# Patient Record
Sex: Male | Born: 1950
Health system: Southern US, Community
[De-identification: ages and names within clinical notes are randomized; demographics above are authoritative.]

## PROBLEM LIST (undated history)

## (undated) DIAGNOSIS — I219 Acute myocardial infarction, unspecified: Secondary | ICD-10-CM

## (undated) DIAGNOSIS — E785 Hyperlipidemia, unspecified: Secondary | ICD-10-CM

## (undated) DIAGNOSIS — I2510 Atherosclerotic heart disease of native coronary artery without angina pectoris: Secondary | ICD-10-CM

## (undated) DIAGNOSIS — I10 Essential (primary) hypertension: Secondary | ICD-10-CM

## (undated) DIAGNOSIS — K219 Gastro-esophageal reflux disease without esophagitis: Secondary | ICD-10-CM

## (undated) DIAGNOSIS — F419 Anxiety disorder, unspecified: Secondary | ICD-10-CM

## (undated) HISTORY — DX: Essential (primary) hypertension: I10

## (undated) HISTORY — PX: CARDIAC CATHETERIZATION: SHX172

## (undated) HISTORY — DX: Hyperlipidemia, unspecified: E78.5

## (undated) HISTORY — DX: Acute myocardial infarction, unspecified: I21.9

## (undated) HISTORY — DX: Atherosclerotic heart disease of native coronary artery without angina pectoris: I25.10

## (undated) HISTORY — DX: Anxiety disorder, unspecified: F41.9

## (undated) HISTORY — DX: Gastro-esophageal reflux disease without esophagitis: K21.9

## (undated) HISTORY — PX: CORONARY ANGIOPLASTY: SHX604

---

## 2006-11-13 ENCOUNTER — Inpatient Hospital Stay (HOSPITAL_COMMUNITY): Admission: EM | Admit: 2006-11-13 | Discharge: 2006-11-16 | Payer: Self-pay | Admitting: Emergency Medicine

## 2006-11-13 ENCOUNTER — Ambulatory Visit: Payer: Self-pay | Admitting: Cardiology

## 2006-12-01 ENCOUNTER — Ambulatory Visit: Payer: Self-pay | Admitting: Cardiovascular Disease

## 2006-12-04 ENCOUNTER — Encounter (HOSPITAL_COMMUNITY): Admission: RE | Admit: 2006-12-04 | Discharge: 2007-03-04 | Payer: Self-pay | Admitting: Cardiovascular Disease

## 2007-01-23 ENCOUNTER — Ambulatory Visit: Payer: Self-pay | Admitting: Cardiovascular Disease

## 2007-01-23 LAB — CONVERTED CEMR LAB
Cholesterol: 103 mg/dL (ref 0–200)
HDL: 26.4 mg/dL — ABNORMAL LOW (ref >39.0)
Total Bilirubin: 0.7 mg/dL (ref 0.3–1.2)
Total CHOL/HDL Ratio: 3.9
Total Protein: 6.4 g/dL (ref 6.0–8.3)
Triglycerides: 160 mg/dL — ABNORMAL HIGH (ref 0–149)

## 2007-03-23 ENCOUNTER — Ambulatory Visit: Payer: Self-pay | Admitting: Cardiovascular Disease

## 2007-09-03 ENCOUNTER — Ambulatory Visit: Payer: Self-pay | Admitting: Cardiovascular Disease

## 2007-09-08 ENCOUNTER — Emergency Department (HOSPITAL_COMMUNITY): Admission: EM | Admit: 2007-09-08 | Discharge: 2007-09-08 | Payer: Self-pay | Admitting: Emergency Medicine

## 2008-04-20 ENCOUNTER — Ambulatory Visit: Payer: Self-pay | Admitting: Cardiovascular Disease

## 2008-04-28 ENCOUNTER — Ambulatory Visit: Payer: Self-pay

## 2008-04-28 ENCOUNTER — Ambulatory Visit: Payer: Self-pay | Admitting: Cardiovascular Disease

## 2008-04-28 LAB — CONVERTED CEMR LAB
ALT: 18 units/L (ref 0–53)
AST: 13 units/L (ref 0–37)
Albumin: 3.8 g/dL (ref 3.5–5.2)
Alkaline Phosphatase: 54 units/L (ref 39–117)
BUN: 11 mg/dL (ref 6–23)
CO2: 29 meq/L (ref 19–32)
Chloride: 106 meq/L (ref 96–112)
Cholesterol: 118 mg/dL (ref 0–200)
Creatinine, Ser: 0.8 mg/dL (ref 0.4–1.5)
Potassium: 4.3 meq/L (ref 3.5–5.1)
Total Bilirubin: 0.9 mg/dL (ref 0.3–1.2)
Triglycerides: 163 mg/dL — ABNORMAL HIGH (ref 0–149)

## 2008-05-30 ENCOUNTER — Ambulatory Visit: Payer: Self-pay | Admitting: Internal Medicine

## 2008-05-30 ENCOUNTER — Ambulatory Visit: Payer: Self-pay

## 2008-06-15 ENCOUNTER — Ambulatory Visit: Payer: Self-pay | Admitting: Cardiovascular Disease

## 2008-06-15 DIAGNOSIS — I2119 ST elevation (STEMI) myocardial infarction involving other coronary artery of inferior wall: Secondary | ICD-10-CM | POA: Insufficient documentation

## 2008-06-15 DIAGNOSIS — I10 Essential (primary) hypertension: Secondary | ICD-10-CM | POA: Insufficient documentation

## 2008-06-15 DIAGNOSIS — E785 Hyperlipidemia, unspecified: Secondary | ICD-10-CM

## 2008-06-15 DIAGNOSIS — I2510 Atherosclerotic heart disease of native coronary artery without angina pectoris: Secondary | ICD-10-CM

## 2008-06-28 ENCOUNTER — Ambulatory Visit: Payer: Self-pay | Admitting: Cardiology

## 2008-09-14 ENCOUNTER — Encounter: Payer: Self-pay | Admitting: Cardiovascular Disease

## 2008-09-14 ENCOUNTER — Ambulatory Visit: Payer: Self-pay | Admitting: Cardiovascular Disease

## 2009-03-16 ENCOUNTER — Ambulatory Visit: Payer: Self-pay | Admitting: Cardiovascular Disease

## 2009-06-12 ENCOUNTER — Encounter: Payer: Self-pay | Admitting: Cardiovascular Disease

## 2009-09-08 ENCOUNTER — Ambulatory Visit: Payer: Self-pay | Admitting: Cardiovascular Disease

## 2009-09-14 ENCOUNTER — Ambulatory Visit: Payer: Self-pay | Admitting: Cardiovascular Disease

## 2009-09-18 LAB — CONVERTED CEMR LAB
ALT: 15 units/L (ref 0–53)
Albumin: 4.2 g/dL (ref 3.5–5.2)
BUN: 11 mg/dL (ref 6–23)
Basophils Absolute: 0.1 10*3/uL (ref 0.0–0.1)
CO2: 27 meq/L (ref 19–32)
Calcium: 9 mg/dL (ref 8.4–10.5)
Chloride: 105 meq/L (ref 96–112)
Cholesterol: 118 mg/dL (ref 0–200)
Creatinine, Ser: 0.9 mg/dL (ref 0.4–1.5)
Direct LDL: 53.3 mg/dL
Eosinophils Absolute: 0.4 10*3/uL (ref 0.0–0.7)
Glucose, Bld: 105 mg/dL — ABNORMAL HIGH (ref 70–99)
Lymphocytes Relative: 32.3 % (ref 12.0–46.0)
MCHC: 34.6 g/dL (ref 30.0–36.0)
MCV: 88.4 fL (ref 78.0–100.0)
Monocytes Absolute: 0.7 10*3/uL (ref 0.1–1.0)
Neutrophils Relative %: 53.1 % (ref 43.0–77.0)
Platelets: 287 10*3/uL (ref 150.0–400.0)
RBC: 4.89 M/uL (ref 4.22–5.81)
RDW: 13.4 % (ref 11.5–14.6)
Total Bilirubin: 0.5 mg/dL (ref 0.3–1.2)
Total CHOL/HDL Ratio: 3
Triglycerides: 226 mg/dL — ABNORMAL HIGH (ref 0.0–149.0)

## 2009-12-05 ENCOUNTER — Telehealth: Payer: Self-pay | Admitting: Cardiovascular Disease

## 2010-03-23 ENCOUNTER — Encounter: Payer: Self-pay | Admitting: Cardiovascular Disease

## 2010-03-23 ENCOUNTER — Ambulatory Visit: Payer: Self-pay | Admitting: Cardiovascular Disease

## 2010-06-26 NOTE — Assessment & Plan Note (Signed)
Summary: f20m   Visit Type:  Follow-up Primary Provider:  none  CC:  none.  History of Present Illness: 60 year-old male with hx inferior MI 2008 treated with overlapping DES in the right coronary artery. The patient is doing well at present without complaints. He specifically denies chest pain, dyspnea, edema, palpitations, orthopnea, or PND. He is active and does hard work but he does not perform regular exercise. He has marked whitecoat hypertension.  I have treated his blood pressures in the past based on office readings and he became highly symptomatic with hypotension at home.  Current Medications (verified): 1)  Plavix 75 Mg Tabs (Clopidogrel Bisulfate) .... Take One Tablet By Mouth Daily 2)  Protonix 40 Mg Tbec (Pantoprazole Sodium) .Marland Kitchen.. 1 Tab By Mouth Every Other Day 3)  Lipitor 40 Mg Tabs (Atorvastatin Calcium) .... Take One Tablet By Mouth Daily. 4)  Coreg 12.5 Mg Tabs (Carvedilol) .... Take 1 Tablet By Mouth Two Times A Day 5)  Hydrochlorothiazide 12.5 Mg Tabs (Hydrochlorothiazide) .... Take One Tablet By Mouth Daily. 6)  Lisinopril 40 Mg Tabs (Lisinopril) .... Take One Tablet By Mouth Daily 7)  Aspirin 81 Mg Tbec (Aspirin) .... Take One Tablet By Mouth Daily  Allergies: 1)  ! * Carvedilol  Past History:  Past medical history reviewed for relevance to current acute and chronic problems.  Past Medical History: Reviewed history from 03/16/2009 and no changes required. Current Problems:  MYOCARDIAL INFARCTION, INFERIOR WALL, INITIAL EPISODE (ICD-410.41)RX OVERLAPPING DRUG-ELUTING STENTS RCA WITH RESIDUAL DISEASE LAD & CFX. EF INITIALLY 40% NOW 60-65% HYPERLIPIDEMIA-MIXED (ICD-272.4 HYPERTENSION, BENIGN (ICD-401.1)WHITE COAT SYNDROME CAD, NATIVE VESSEL (ICD-414.01) Anxiety G E R D TOBACCO ABUSE  Review of Systems       Negative except as per HPI   Vital Signs:  Patient profile:   60 year old male Height:      65 inches Weight:      178 pounds BMI:      29.73 Pulse rate:   78 / minute Resp:     14 per minute BP sitting:   178 / 110  (left arm) BP standing:   160 / 98  (right arm)  Vitals Entered By: Kem Parkinson (September 08, 2009 8:48 AM)  Physical Exam  General:  Pt is alert and oriented, in no acute distress. HEENT: normal Neck: normal carotid upstrokes without bruits, JVP normal Lungs: CTA CV: RRR without murmur or gallop Abd: soft, obese, NT, positive BS, no bruit, no organomegaly Ext: no clubbing, cyanosis, or edema. peripheral pulses 2+ and equal Skin: warm and dry without rash    EKG  Procedure date:  09/09/2009  Findings:      Normal sinus rhythm, heart rate 78 beats per minute, no significant ST or T wave changes.  Impression & Recommendations:  Problem # 1:  CAD, NATIVE VESSEL (ICD-414.01) The patient remained stable without angina.  Recommend long-term dual antiplatelet therapy since he is tolerating this well and he was treated with overlapping drug-eluting stents in the setting of acute myocardial infarction His updated medication list for this problem includes:    Plavix 75 Mg Tabs (Clopidogrel bisulfate) .Marland Kitchen... Take one tablet by mouth daily    Coreg 12.5 Mg Tabs (Carvedilol) .Marland Kitchen... Take 1 tablet by mouth two times a day    Lisinopril 40 Mg Tabs (Lisinopril) .Marland Kitchen... Take one tablet by mouth daily    Aspirin 81 Mg Tbec (Aspirin) .Marland Kitchen... Take one tablet by mouth daily  Problem # 2:  HYPERTENSION,  BENIGN (ICD-401.1) office blood pressures remain markedly elevated. We've been through this in the past and he does not tolerate an increase in his antihypertensive program. I asked him to get back to checking blood pressures at home to make sure that his home readings are within normal limits.  His updated medication list for this problem includes:    Coreg 12.5 Mg Tabs (Carvedilol) .Marland Kitchen... Take 1 tablet by mouth two times a day    Hydrochlorothiazide 12.5 Mg Tabs (Hydrochlorothiazide) .Marland Kitchen... Take one tablet by mouth  daily.    Lisinopril 40 Mg Tabs (Lisinopril) .Marland Kitchen... Take one tablet by mouth daily    Aspirin 81 Mg Tbec (Aspirin) .Marland Kitchen... Take one tablet by mouth daily  BP today: 178/110 Prior BP: 162/90 (03/16/2009)  Labs Reviewed: K+: 4.3 (04/28/2008) Creat: : 0.8 (04/28/2008)   Chol: 118 (04/28/2008)   HDL: 27.4 (04/28/2008)   LDL: 58 (04/28/2008)   TG: 163 (04/28/2008)  Problem # 3:  HYPERLIPIDEMIA-MIXED (ICD-272.4) Patient is due for repeat lipids and LFTs. His updated medication list for this problem includes:    Lipitor 40 Mg Tabs (Atorvastatin calcium) .Marland Kitchen... Take one tablet by mouth daily.  CHOL: 118 (04/28/2008)   LDL: 58 (04/28/2008)   HDL: 27.4 (04/28/2008)   TG: 163 (04/28/2008)  Patient Instructions: 1)  Your physician recommends that you schedule a follow-up appointment in: 6 months 2)  Your physician recommends that you return for lab work in:  please come back next week for  CBC, LIPID PANEL,CMET

## 2010-06-26 NOTE — Letter (Signed)
Summary: [facility]  [facility]   Imported By: Roderic Ovens 07/04/2009 13:09:06  _____________________________________________________________________  External Attachment:    Type:   Image     Comment:   External Document

## 2010-06-26 NOTE — Progress Notes (Signed)
Summary: refill request  Phone Note Refill Request   pt requesting rx for nitroquick-pt going out of town wants to take rx w/him pt's wife insisting on a call when it has been called in-pls call [phone]   Method Requested: Telephone to Pharmacy Initial call taken by: Glynda Jaeger,  December 05, 2009 10:20 AM  Follow-up for Phone Call        [facility] called into the pharmacy. I spoke with the pt's wife and made her aware that this Rx was called into [facility] [address].  She was very upset and rude because she said that she called our office yesterday about needing Rx called in so that they could go on vacation.  I made her aware that there is not a note in our system that she called our office yesterday.  I apologized for the miscommunication.  Follow-up by: Julieta Gutting, RN, BSN,  December 05, 2009 10:50 AM    New/Updated Medications: NITROSTAT 0.4 MG SUBL (NITROGLYCERIN) 1 tablet under tongue at onset of chest pain; you may repeat every 5 minutes for up to 3 doses.

## 2010-06-26 NOTE — Assessment & Plan Note (Signed)
Summary: F/U 6 MONTHS   Visit Type:  Follow-up Primary Provider:  none  CC:  Pt. quit taking HCTZ in June- side effects.  History of Present Illness: 60 year-old male with hx inferior MI 2008 treated with overlapping DES in the right coronary artery. The patient is doing well at present without complaints. He specifically denies chest pain, dyspnea, edema, palpitations, orthopnea, or PND. He is active and does hard work but he does not perform regular exercise. He has marked whitecoat hypertension.  I have treated his blood pressures in the past based on office readings and he became highly symptomatic with hypotension at home.  He developed dizziness and attributed this to HCTZ....stopped the medication and symptom resoved within one week. No other problems reported today. Continues to smoke cigarettes.    Current Medications (verified): 1)  Plavix 75 Mg Tabs (Clopidogrel Bisulfate) .... Take One Tablet By Mouth Daily 2)  Protonix 40 Mg Tbec (Pantoprazole Sodium) .Marland Kitchen.. 1 Tab By Mouth Every Other Day 3)  Lipitor 40 Mg Tabs (Atorvastatin Calcium) .... Take One Tablet By Mouth Daily. 4)  Coreg 12.5 Mg Tabs (Carvedilol) .... Take 1 Tablet By Mouth Two Times A Day 5)  Lisinopril 40 Mg Tabs (Lisinopril) .... Take One Tablet By Mouth Daily 6)  Aspirin 81 Mg Tbec (Aspirin) .... Take One Tablet By Mouth Daily 7)  Nitrostat 0.4 Mg Subl (Nitroglycerin) .Marland Kitchen.. 1 Tablet Under Tongue At Onset of Chest Pain; You May Repeat Every 5 Minutes For Up To 3 Doses.  Allergies: 1)  ! * Carvedilol 2)  ! Hydrochlorothiazide  Past History:  Past medical history reviewed for relevance to current acute and chronic problems.  Past Medical History: Reviewed history from 03/16/2009 and no changes required. Current Problems:  MYOCARDIAL INFARCTION, INFERIOR WALL, INITIAL EPISODE (ICD-410.41)RX OVERLAPPING DRUG-ELUTING STENTS RCA WITH RESIDUAL DISEASE LAD & CFX. EF INITIALLY 40% NOW 60-65% HYPERLIPIDEMIA-MIXED  (ICD-272.4 HYPERTENSION, BENIGN (ICD-401.1)WHITE COAT SYNDROME CAD, NATIVE VESSEL (ICD-414.01) Anxiety G E R D TOBACCO ABUSE  Review of Systems       Negative except as per HPI   Vital Signs:  Patient profile:   60 year old male Height:      65 inches Weight:      179 pounds BMI:     29.89 Pulse rate:   73 / minute Pulse rhythm:   regular Resp:     18 per minute BP sitting:   164 / 100  (left arm) Cuff size:   large  Vitals Entered By: Vikki Ports (March 23, 2010 8:48 AM)  Physical Exam  General:  Pt is alert and oriented, in no acute distress. HEENT: normal Neck: normal carotid upstrokes without bruits, JVP normal Lungs: CTA CV: RRR without murmur or gallop Abd: soft, obese, NT, positive BS, no bruit, no organomegaly Ext: no clubbing, cyanosis, or edema. peripheral pulses 2+ and equal Skin: warm and dry without rash    EKG  Procedure date:  03/23/2010  Findings:      NSR 73 bpm within normal limits  Impression & Recommendations:  Problem # 1:  CAD, NATIVE VESSEL (ICD-414.01) Stable without angina, continue current medical regimen. Discussed the importance of tobacco cessation but he is not interested in quitting.  His updated medication list for this problem includes:    Plavix 75 Mg Tabs (Clopidogrel bisulfate) .Marland Kitchen... Take one tablet by mouth daily    Coreg 12.5 Mg Tabs (Carvedilol) .Marland Kitchen... Take 1 tablet by mouth two times a day  Lisinopril 40 Mg Tabs (Lisinopril) .Marland Kitchen... Take one tablet by mouth daily    Aspirin 81 Mg Tbec (Aspirin) .Marland Kitchen... Take one tablet by mouth daily    Nitrostat 0.4 Mg Subl (Nitroglycerin) .Marland Kitchen... 1 tablet under tongue at onset of chest pain; you may repeat every 5 minutes for up to 3 doses.  Problem # 2:  HYPERLIPIDEMIA-MIXED (ICD-272.4)  Repeat lipids in 6 months. He is at goal.  His updated medication list for this problem includes:    Lipitor 40 Mg Tabs (Atorvastatin calcium) .Marland Kitchen... Take one tablet by mouth daily.  CHOL: 118  (09/14/2009)   LDL: 58 (04/28/2008)   HDL: 35.70 (09/14/2009)   TG: 226.0 (09/14/2009)  His updated medication list for this problem includes:    Lipitor 40 Mg Tabs (Atorvastatin calcium) .Marland Kitchen... Take one tablet by mouth daily.  Orders: EKG w/ Interpretation (93000)  Problem # 3:  HYPERTENSION, BENIGN (ICD-401.1)  Didn't tolerate HCTZ secondary to dizziness. Reports home BP's in the 120's/80's. Always has had marked white-coat HTN.  The following medications were removed from the medication list:    Hydrochlorothiazide 12.5 Mg Tabs (Hydrochlorothiazide) .Marland Kitchen... Take one tablet by mouth daily. His updated medication list for this problem includes:    Coreg 12.5 Mg Tabs (Carvedilol) .Marland Kitchen... Take 1 tablet by mouth two times a day    Lisinopril 40 Mg Tabs (Lisinopril) .Marland Kitchen... Take one tablet by mouth daily    Aspirin 81 Mg Tbec (Aspirin) .Marland Kitchen... Take one tablet by mouth daily  The following medications were removed from the medication list:    Hydrochlorothiazide 12.5 Mg Tabs (Hydrochlorothiazide) .Marland Kitchen... Take one tablet by mouth daily. His updated medication list for this problem includes:    Coreg 12.5 Mg Tabs (Carvedilol) .Marland Kitchen... Take 1 tablet by mouth two times a day    Lisinopril 40 Mg Tabs (Lisinopril) .Marland Kitchen... Take one tablet by mouth daily    Aspirin 81 Mg Tbec (Aspirin) .Marland Kitchen... Take one tablet by mouth daily  Orders: EKG w/ Interpretation (93000)  Patient Instructions: 1)  Your physician recommends that you return for a FASTING LIPID, LIVER, BMP and CBC in April 2012 (412, 272.0, 401.9)  2)  Your physician recommends that you continue on your current medications as directed. Please refer to the Current Medication list given to you today. 3)  Your physician wants you to follow-up in: 6 MONTHS.  You will receive a reminder letter in the mail two months in advance. If you don't receive a letter, please call our office to schedule the follow-up appointment.

## 2010-09-24 ENCOUNTER — Encounter: Payer: Self-pay | Admitting: *Deleted

## 2010-09-24 ENCOUNTER — Encounter: Payer: Self-pay | Admitting: Cardiovascular Disease

## 2010-09-25 ENCOUNTER — Encounter: Payer: Self-pay | Admitting: Cardiovascular Disease

## 2010-09-26 ENCOUNTER — Ambulatory Visit (INDEPENDENT_AMBULATORY_CARE_PROVIDER_SITE_OTHER): Payer: 59 | Admitting: Cardiovascular Disease

## 2010-09-26 ENCOUNTER — Encounter: Payer: Self-pay | Admitting: Cardiovascular Disease

## 2010-09-26 DIAGNOSIS — I10 Essential (primary) hypertension: Secondary | ICD-10-CM

## 2010-09-26 DIAGNOSIS — E7800 Pure hypercholesterolemia, unspecified: Secondary | ICD-10-CM

## 2010-09-26 DIAGNOSIS — I2510 Atherosclerotic heart disease of native coronary artery without angina pectoris: Secondary | ICD-10-CM

## 2010-09-26 DIAGNOSIS — E785 Hyperlipidemia, unspecified: Secondary | ICD-10-CM

## 2010-09-26 NOTE — Assessment & Plan Note (Signed)
The patient is stable without angina. He was treated with overlapping first generation drug alluding stents during an acute infarction. Because of that I think he should be continued on long-term dual antiplatelet therapy with aspirin and Plavix. He has tolerated these drugs well without any bleeding problems. He otherwise should continue secondary risk reduction measures with carvedilol, lisinopril, and atorvastatin. We discussed tobacco cessation at length. He has not expressed a lot of interest in quitting but states that he may try to quit when he retires. He is not interested in medications that may help with tobacco cessation.

## 2010-09-26 NOTE — Patient Instructions (Signed)
Your physician wants you to follow-up in: 6 MONTHS.  You will receive a reminder letter in the mail two months in advance. If you don't receive a letter, please call our office to schedule the follow-up appointment.  Your physician recommends that you continue on your current medications as directed. Please refer to the Current Medication list given to you today.  Your physician recommends that you return for a FASTING lipid profile, liver, BMP and CBC--lab opens at 8:30, nothing to eat or drink after midnight.

## 2010-09-26 NOTE — Progress Notes (Signed)
HPI:  This is a 60 year old gentleman with coronary artery disease and hypertension presenting for follow up evaluation. The patient initially presented with an inferior wall MI in 2008 he was treated with overlapping drug-eluting stents in the right coronary artery. He has had no further ischemic event since that time. He has marked white coat hypertension and has been intolerant to any escalation of his antihypertensive therapy. Multiple medications have been tried.  The patient is doing very well. He is not engage in regular exercise but he leads a very active lifestyle. He has no symptoms with exertion. He specifically denies chest pain, dyspnea, edema, palpitations, lightheadedness, or syncope. He reports good medication compliance. Home blood pressures have been in good range but he has not checked them recently. I asked him to get back to checking his blood pressure at least once per month.  Outpatient Encounter Prescriptions as of 09/26/2010  Medication Sig Dispense Refill  . aspirin 81 MG tablet Take 81 mg by mouth daily.        Marland Kitchen atorvastatin (LIPITOR) 40 MG tablet Take 40 mg by mouth daily.        . carvedilol (COREG) 12.5 MG tablet Take 12.5 mg by mouth 2 (two) times daily with a meal.        . clopidogrel (PLAVIX) 75 MG tablet Take 75 mg by mouth daily.        Marland Kitchen lisinopril (PRINIVIL,ZESTRIL) 40 MG tablet Take 40 mg by mouth daily.        . nitroGLYCERIN (NITROSTAT) 0.4 MG SL tablet Place 0.4 mg under the tongue every 5 (five) minutes as needed.        . pantoprazole (PROTONIX) 40 MG tablet Take 40 mg by mouth daily.          Allergies  Allergen Reactions  . Carvedilol     REACTION: can only take low doses  . Hydrochlorothiazide     Past Medical History  Diagnosis Date  . Myocardial infarct      MYOCARDIAL INFARCTION, INFERIOR WALL, INITIAL EPISODE (ICD-410.41)RX OVERLAPPING  DRUG-ELUDTING STENTS RCA WITH RESIDUAL DISEASE LAD & CFX. EF INITIALLY 40% NOW 60-65%  . Hyperlipidemia     . HTN (hypertension)   . CAD (coronary artery disease)   . Anxiety   . GERD (gastroesophageal reflux disease)     ROS: Negative except as per HPI  BP 162/92  Pulse 66  Resp 18  Ht 5\' 5"  (1.651 m)  Wt 176 lb 12.8 oz (80.196 kg)  BMI 29.42 kg/m2  PHYSICAL EXAM: Pt is alert and oriented, NAD HEENT: normal Neck: JVP - normal, carotids 2+= without bruits Lungs: CTA bilaterally CV: RRR without murmur or gallop Abd: soft, NT, Positive BS, no hepatomegaly Ext: no C/C/E, distal pulses intact and equal Skin: warm/dry no rash  EKG:  Normal sinus rhythm 66 beats per minute, within normal limits.  ASSESSMENT AND PLAN:

## 2010-09-26 NOTE — Assessment & Plan Note (Signed)
Will continue current medical therapy. Blood pressure today is lower than it usually is in the office. Again I asked him to resume checking his blood pressure at home. It has been in good range in the past.  I talked to him about getting her primary care physician. 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

## 2010-09-26 NOTE — Assessment & Plan Note (Signed)
Lipids were checked one year ago when he was at goal. Will repeat. He continues on atorvastatin.  He has had low HDL cholesterol and hypertriglyceridemia.

## 2010-10-02 ENCOUNTER — Other Ambulatory Visit (INDEPENDENT_AMBULATORY_CARE_PROVIDER_SITE_OTHER): Payer: 59 | Admitting: *Deleted

## 2010-10-02 DIAGNOSIS — E7800 Pure hypercholesterolemia, unspecified: Secondary | ICD-10-CM

## 2010-10-02 DIAGNOSIS — I10 Essential (primary) hypertension: Secondary | ICD-10-CM

## 2010-10-02 DIAGNOSIS — I2510 Atherosclerotic heart disease of native coronary artery without angina pectoris: Secondary | ICD-10-CM

## 2010-10-02 LAB — CBC WITH DIFFERENTIAL/PLATELET
Basophils Relative: 0.8 % (ref 0.0–3.0)
Eosinophils Relative: 7.9 % — ABNORMAL HIGH (ref 0.0–5.0)
Lymphocytes Relative: 33.8 % (ref 12.0–46.0)
MCV: 89.2 fl (ref 78.0–100.0)
Monocytes Absolute: 0.5 10*3/uL (ref 0.1–1.0)
Neutrophils Relative %: 51.1 % (ref 43.0–77.0)
Platelets: 265 10*3/uL (ref 150.0–400.0)
RBC: 5.11 Mil/uL (ref 4.22–5.81)
WBC: 8.4 10*3/uL (ref 4.5–10.5)

## 2010-10-02 LAB — LIPID PANEL
Cholesterol: 128 mg/dL (ref 0–200)
HDL: 31.4 mg/dL — ABNORMAL LOW (ref >39.00)
LDL Cholesterol: 58 mg/dL (ref 0–99)
VLDL: 38.8 mg/dL (ref 0.0–40.0)

## 2010-10-02 LAB — BASIC METABOLIC PANEL
BUN: 12 mg/dL (ref 6–23)
CO2: 26 mEq/L (ref 19–32)
Calcium: 8.9 mg/dL (ref 8.4–10.5)
Chloride: 106 mEq/L (ref 96–112)
Creatinine, Ser: 0.8 mg/dL (ref 0.4–1.5)
GFR: 103.34 mL/min (ref >60.00)
Glucose, Bld: 96 mg/dL (ref 70–99)
Potassium: 4.7 mEq/L (ref 3.5–5.1)
Sodium: 141 mEq/L (ref 135–145)

## 2010-10-02 LAB — HEPATIC FUNCTION PANEL
ALT: 15 U/L (ref 0–53)
AST: 12 U/L (ref 0–37)
Albumin: 4.1 g/dL (ref 3.5–5.2)
Alkaline Phosphatase: 81 U/L (ref 39–117)
Bilirubin, Direct: 0.1 mg/dL (ref 0.0–0.3)
Total Bilirubin: 0.8 mg/dL (ref 0.3–1.2)
Total Protein: 6.9 g/dL (ref 6.0–8.3)

## 2010-10-09 NOTE — Assessment & Plan Note (Signed)
[facility]                            CARDIOLOGY OFFICE NOTE   JAZIEL, BENNETT                       MRN:          [id number]  DATE:09/03/2007                            DOB:          1951-05-21    Ferguson Gertner was seen in followup at [facility] office on September 03, 2007.  Mr. Mom is a 60 year old gentleman with coronary artery  disease who sustained an inferior MI in June 2008.  He was treated with  overlapping drug-eluting stents in the right coronary artery.  He had  moderate disease in the LAD and left circumflex.  Initial LV EF was  depressed, but his followup study showed good function with an EF of 60-  65%.  Mr. Carpenito is doing well from a symptomatic standpoint.  He has  no complaints of chest pain, dyspnea, orthopnea, PND, edema or exercise  intolerance.  He said no lightheadedness or syncope.  He unfortunately  has started smoking cigarettes again.  He is not engaged in regular  exercise.   MEDICATIONS:  1. Aspirin 325 mg daily.  2. Plavix 75 mg daily.  3. Protonix 40 mg daily.  4. Lisinopril 10 mg daily.  5. Lipitor 40 mg daily.  6. Coreg 12.5 mg twice daily.   ALLERGIES:  NKDA.   PHYSICAL EXAMINATION:  He is alert and oriented and in no distress.  Weight 180, blood pressure 174/110, heart rate 74, respiratory rate 16.  HEENT:  Normal.  NECK:  Normal carotid upstrokes without bruits.  Jugulovenous pressure  normal.  LUNGS:  Clear bilaterally.  HEART:  Regular rate and rhythm without murmurs or gallops.  ABDOMEN:  Soft, nontender, no organomegaly.  EXTREMITIES:  No clubbing, cyanosis or edema.  Peripheral pulses 2+ and  equal throughout.   EKG shows sinus rhythm with age indeterminate inferior MI.  Otherwise,  within normal limits.   Lipids from August 29 showed a cholesterol of 103, triglycerides 160,  HDL 26, LDL 45. Lipitor dose was reduced from 80 to 40 mg that time.   ASSESSMENT:  1. Coronary artery  disease status post inferior MI treated with drug-      eluting stent to the right coronary artery.  If he is tolerating      Plavix well I would recommend continuing dual antiplatelet therapy      with combination of aspirin and Plavix.  He was advised to reduce      his aspirin dose to 81 mg.  Smoking cessation and exercise program      advised.  No evidence of angina at present.  2. Hypertension, suboptimal control.  Lisinopril and Coreg were both      doubled to20 mg daily and 25 mg twice daily, respectively.  Follow-      up home blood pressure monitoring was recommended.  3. Dyslipidemia, continue atorvastatin 40 mg.  Followup lipids and      LFTs in August.   For followup, I would like to see Mr. Bouknight back in 6 months.     Veverly Fells. Excell Seltzer, MD  Electronically Signed  MDC/MedQ  DD: 09/07/2007  DT: 09/07/2007  Job #: [id number]

## 2010-10-09 NOTE — Assessment & Plan Note (Signed)
[facility]                            CARDIOLOGY OFFICE NOTE   LEONA, PRESSLY                       MRN:          [id number]  DATE:06/15/2008                            DOB:          [birth year]    PRIMARY CARDIOLOGIST:  Veverly Fells. Excell Seltzer, MD   CLINICAL HISTORY:  Mr. Strupp is a 60 year old white male patient who  had an inferior MI in June 2008 treated with overlapping drug-eluting  stents to RCA.  He had moderate disease in the LAD and left circumflex  and ejection fraction of 65%.  He most recently has suffered from  hypertension with white coat syndrome.  He saw Nicolasa Ducking in the  office last week complaining of side effects from his Coreg.  Cristal Deer agreed to cut his Coreg in half and recommended increasing  his lisinopril and hydrochlorothiazide, but the patient wanted to hold  off on this.  He then ordered ambulatory blood pressure monitor which  revealed suboptimal blood pressure control, and Dr. Excell Seltzer also  recommended he increase his lisinopril and hydrochlorothiazide.  The  patient has not done that yet, comes in today and blood pressure is  still elevated.  He says many of his symptoms have resolved since he has  cut back on the Coreg.  He says his blood pressure is usually good when  he checks it at home, but I did go over his ambulatory blood pressure  report and he had pressures anywhere from mean systolic pressure of 118-  151 over 62-91, but it got as high as 211/129 as well as 102/45.  I  spent a lot of time talking with the patient about the importance of  compliance with his medications, and he is agreeable to go up on his  lisinopril and hydrochlorothiazide.   CURRENT MEDICATIONS:  1. Plavix 75 mg daily.  2. Protonix 40 mg daily.  3. Lipitor 40 mg daily.  4. Coreg 12.5 mg b.i.d.  5. Hydrochlorothiazide 12.5 mg daily.  6. Lisinopril 20 mg daily.  7. Aspirin 81 mg daily.   PHYSICAL EXAMINATION:  This is an  anxious 60 year old white male in no  acute distress.  Blood pressure 173/104, when I retook it, it remained  the same.  Pulse 81.  He was not weighed.  Neck is without JVD, HJR,  bruit, or thyroid enlargement.  Lungs are clear anterior, posterior, and  lateral.  Heart is regular rate and rhythm at 80 beats per minute.  Normal S1 and S2.  No murmur, rub, bruit, thrill, or heave noted.  Abdomen is soft without organomegaly, masses, lesions, or abnormal  tenderness.  Extremities without cyanosis or edema.  He has as good  distal pulses.   IMPRESSION:  1. Hypertension, uncontrolled.  2. Coronary artery disease status post inferior wall myocardial      infarction in July 02, 2006, treated with overlapping drug-      eluting stents to the right coronary artery.  He has residual left      anterior descending and circumflex disease, ejection fraction 65%.  3. Hyperlipidemia.  4. Ongoing  tobacco abuse.  5. Stress and anxiety.   PLAN:  The patient is willing to increase his lisinopril to 40 mg daily  and hydrochlorothiazide to 25 mg daily.  He asked for renewal of his  Protonix prescription which we will give him despite being on Plavix.  He says this helps greatly.  I told him to continue his Coreg at the  current dose since he feels better on the lower dose.  I have asked him  to continue to monitor his blood pressures at home.  We will see him  back in 2 weeks and he can see Dr. Excell Seltzer back in 2 months.      Jacolyn Reedy, PA-C  Electronically Signed      Duke Salvia, MD, [facility]  Electronically Signed   ML/MedQ  DD: 06/15/2008  DT: 06/16/2008  Job #: [id number]

## 2010-10-09 NOTE — Assessment & Plan Note (Signed)
[facility]                            CARDIOLOGY OFFICE NOTE   GERALD, KUEHL                       MRN:          [id number]  DATE:03/23/2007                            DOB:          [birth year]    HISTORY:  Mr. Alexander [name] returns for followup to the [facility]  Cardiology Office on March 23, 2007.  Alexander [name] is a 60 year old  gentleman, hospitalized in June with an acute inferior myocardial  infarction.  He was treated with overlapping Taxus drug-eluting stents  in the right coronary artery because of a long segment of disease in  that vessel.  He also was noted to have moderate disease in the LAD and  left circumflex.  His LVEF at the time of catheterization was 40% by  ventriculography.  He had an echocardiogram in the office today that  showed complete normalization of his LV function with an LVEF in the  range of 60%-65%.  He had no regional wall motion abnormalities.   From the symptomatic standpoint, Alexander [name] is doing well.  He has just  completed cardiac rehab approximately one week ago.  He has been to the  [facility] a few times in the interim.  He denies chest pain, dyspnea,  orthopnea, PND, lightheadedness, palpitations or syncope.  He has had no  calf claudication.   MEDICATIONS:  1. Aspirin 325 mg daily.  2. Plavix 75 mg daily.  3. Lipitor 40 mg daily.  4. Coreg 6.25 mg twice daily.  5. Protonix 40 mg daily.  6. Lisinopril 10 mg daily.   ALLERGIES:  No known drug allergies.   PHYSICAL EXAMINATION:  GENERAL:  The patient is alert and oriented, in  no acute distress.  VITAL SIGNS:  Weight 179 pounds, blood pressure 160/99 on my recheck.  It was 160/85.  Heart rate is 71.  HEENT:  Normal.  NECK:  Normal carotid upstrokes without bruits.  Jugular venous pressure  normal.  LUNGS:  Clear to auscultation bilaterally.  HEART:  A regular rate and rhythm without murmurs or gallops.  ABDOMEN:  Soft, nontender, no organomegaly.  EXTREMITIES:  No clubbing, cyanosis or edema.  Peripheral pulses 2+ and  equal throughout.   Electrocardiogram shows a normal sinus rhythm and is within normal  limits.   Lipids from January 23, 2007, show a total cholesterol of 103,  triglycerides 160, HDL 26, LDL 45.  Liver function tests were normal.   ASSESSMENT/PLAN:  1. Coronary artery disease, currently asymptomatic:  I am pleased that      his left ventricular function has normalized.  He should continue a      minimum of 12 months with dual antiplatelet therapy, which will      take him through June of next year.  Continue secondary aggressive      risk reduction, as outlined below. I have encouraged him toward      ongoing regular exercise.  2. Hypertension:  His blood pressure control is clearly sub-optimal.      Alexander [name] initially informed me that his blood pressures had been  good at cardiac rehab and that today's readings were related to      white coat hypertension; however, we have requested readings from      cardiac rehab and over the month of September and October.  He does      have several elevated blood pressures.  The majority of blood      pressure readings are good, but there are systolic readings as high      as 170.  I think at this point we should increase his Coreg to 12.5      mg twice daily and leave his other medications unchanged.  3. Dyslipidemia:  LDL is excellent.  Continue Lipitor 40 mg.   FOLLOWUP:  I would like to see Alexander [name] back in six months or sooner  if any new problems arise.     Veverly Fells. Excell Seltzer, MD  Electronically Signed    MDC/MedQ  DD: 03/23/2007  DT: 03/24/2007  Job #: [id number]

## 2010-10-09 NOTE — Assessment & Plan Note (Signed)
[facility]                            CARDIOLOGY OFFICE NOTE   Alexander [name], Alexander [name]                       MRN:          [id number]  DATE:12/01/2006                            DOB:          09/07/1950    Alexander [name] was seen in hospital followup at the [facility] Cardiology  office on December 01, 2006.  He is a 60 year old gentleman who was  hospitalized from June 19 through June 22 after suffering an inferior  myocardial infarction.  He was taken emergently to the cardiac cath lab  where he was treated with overlapping Taxus drug-eluting stents.  He had  a long segment of severe disease in the right coronary artery.  He was  also found to have moderate disease in the mid LAD and mid portion of  the left circumflex.  His LV EF at the time of his catheterization was  estimated to be 40% by ventriculography.  The remainder of his hospital  course was uneventful, and he was discharged home a few days after his  myocardial infarction on medical therapy.  He had not sought regular  medical care prior to his hospitalization, and he was also diagnosed  with hypertension and dyslipidemia during his hospital stay.  His lipid  panel in the hospital showed a total cholesterol of 186, triglycerides  of 177, and HDL of 34, and an LDL of 117.   From a symptomatic standpoint, Alexander [name] has been doing very well.  He  is going to orient to cardiac rehab later this week.  He feels ready to  return to work.  He specifically denies any chest pain, dyspnea,  lightheadedness, syncope, orthopnea, PND, or edema.  He is frustrated  about dietary limitations, as his wife has been carefully restricting  his fat intake.  He informs me that he has lost approximately 10 pounds  since discharge home from the hospital.  He has no other complaints at  this time.  He has been compliant with his medications and is tolerating  them without side effects at this point.   CURRENT  MEDICATIONS:  1. Aspirin 325 mg daily.  2. Plavix 75 mg daily.  3. Lipitor 80 mg daily.  4. Coreg 6.25 mg twice daily.  5. Protonix 40 mg daily.   PHYSICAL EXAMINATION:  The patient is alert and oriented.  He is in no  acute distress.  Weight is 176 pounds, blood pressure is 142/90, heart rate is 80,  respiratory rate is 16.  HEENT:  Normal.  NECK:  Normal carotid upstrokes without bruits.  Jugular venous pressure  is normal.  LUNGS:  Clear to auscultation bilaterally.  HEART:  Apex is discrete and nondisplaced.  The heart is regular rate  and rhythm without murmurs or gallops.  ABDOMEN:  Soft, nontender, no organomegaly, no abdominal bruits.  EXTREMITIES:  No cyanosis, clubbing, or edema.  Peripheral pulses are 2+  and equal throughout.  SKIN:  Warm and dry without rash.   EKG shows normal sinus rhythm with a heart rate of 80 beats per minute.  There is a possible  inferior infarct pattern of indeterminate age.   ASSESSMENT:  Alexander [name] is currently stable from a cardiac standpoint  after his recent ST elevation myocardial infarction.  His cardiac  problems are as follows:  1. Inferior Myocardial infarction.  He is tolerating medical therapy      well.  He needs to continue aspirin and clopidogrel for a minimum      of 1 year.  I encouraged him about starting with cardiac rehab      which he is already scheduled for.  He should continue on Coreg and      we may need to titrate his medication upward, but will wait until      he is seen in followup as he is just getting acclimated to taking      multiple medications.  It would be okay for him to return to work      at this point.  We have filled out paperwork for such.  2. Lipids as above.  He will be due for lipid and LFTs in 8 weeks, and      will continue Lipitor 80 mg at this point.  3. Hypertension.  Blood pressure is borderline.  He will ultimately      need more medication and we will either double his Coreg or add an       angiotensin-converting enzyme inhibitor when he is seen in close      followup.  4. Tobacco abuse.  He has been able to stop smoking.  I encouraged him      in that regard.  5. Followup.  I would like to see Alexander [name] back in 3 months or      sooner if any new problems arise.     Alexander [name]. Alexander Seltzer, MD  Electronically Signed    MDC/MedQ  DD: 12/06/2006  DT: 12/07/2006  Job #: [id number]

## 2010-10-09 NOTE — Assessment & Plan Note (Signed)
[facility]                            CARDIOLOGY OFFICE NOTE   Alexander [name], Alexander [name]                       MRN:          [id number]  DATE:05/30/2008                            DOB:          [birth year]    PRIMARY CARDIOLOGIST:  Veverly Fells. Excell Seltzer, MD   PATIENT PROFILE:  A 60 year old Caucasian male with prior history of CAD  and hypertension, who presents secondary to ongoing hypertension.   PROBLEMS:  1. Coronary artery disease.      a.     Status post inferior MI in June 2008 with overlapping drug-       eluting stent placement to a severely diseased right coronary       artery.  The patient was also noted to have moderate disease in       the LAD and left circumflex.  EF 65%.  2. Hypertension/white coat hypertension.  3. Hyperlipidemia.  4. Ongoing tobacco abuse.   HISTORY OF PRESENT ILLNESS:  A 60 year old Caucasian male with the above  problem list.  The patient was last seen in clinic by Dr. Excell Seltzer,  April 20, 2008, at which time, he was hypertensive and his Coreg was  increased to 25 mg b.i.d.  Over the past 7 months, the patient has noted  that with subsequent titrations or with repeated titrations in Coreg  that he has become increasingly more anxious, somewhat depressed, and  also suffer from some insomnia as well as reduced libido and trouble  maintaining erection.  He would really like to drop his carvedilol dose  back down and is not interested in going up on any of his other  medications at this time.  He came into the office initially today for  blood pressure check and when it was noted to be elevated in 170-180s,  an appointment was made to see me.  He currently denies any chest pain  or dyspnea.  He does report anxiety and seems to have a fair amount of  anxiety with regards to medicines that he is taking as well as changes  in his sex life.   ALLERGIES:  No known drug allergies.   HOME MEDICATIONS:  1. Plavix 75 mg  daily.  2. Protonix 40 mg daily.  3. Lipitor 40 mg daily.  4. Hydrochlorothiazide 12.5 mg daily.  5. Aspirin 81 mg daily.  6. Lisinopril 20 mg daily.  7. Carvedilol 25 mg b.i.d.   PHYSICAL EXAMINATION:  VITAL SIGNS:  Blood pressures 189/98, 170/100 on  repeat, heart rate 78, respirations 20, and weight is 181 pounds.  He is  afebrile.  GENERAL:  A pleasant white male in no acute distress.  NEUROLOGIC:  Awake, alert, and oriented x3.  HEENT:  Normal.  Nares grossly intact, nonfocal.  SKIN:  Warm and dry without lesions or masses.  NECK:  No bruits or JVD.  LUNGS:  Respirations are unlabored.  Clear to auscultation.  CARDIAC:  Regular S1 and S2.  No S3, S4, or murmurs.  ABDOMEN:  Round, soft, nontender, and nondistended.  Bowel sounds  present x4.  EXTREMITIES:  Warm, dry, and pink.  No clubbing, cyanosis, or edema.  Dorsalis pedis and posterior tibial pulses 2+ and equal bilaterally.   ACCESSORY CLINICAL FINDINGS:  None.   ASSESSMENT AND PLAN:  1. Coronary artery disease.  The patient appears to be doing well from      this standpoint.  He remains active and does work without any      limiting chest pain or dyspnea.  He remains on aspirin, beta-      blocker, statin, and ACE inhibitor therapy.  2. Hypertension.  The patient has a lot of anxiety with regards to his      hypertension and he feels that it is predominantly related to white      coat hypertension, stating that if he check his blood pressure 2 or      3 times in a row at home and by the third reading or so, his      pressures be in 130s.  I checked his pressures multiple times today      in the office and was never able to get a many lower than 170 or      so.  Because he does appear to exhibit some symptoms of anxiety,      depression, reduced libido with higher doses of beta-blocker, I      agreed to drop his Coreg dose to 12.5 mg b.i.d.  I did recommend      that we up his lisinopril to 4o and his  hydrochlorothiazide to 25;      however, the patient is not willing to do this at this time.  I did      get him to agree to wearing an ambulatory blood pressure cuff, so      that we can get a better idea of what his pressures really run      throughout the day and then make more informed decisions at that      point rather than talking all this up to white coat hypertension as      I suspect this is playing a role, but not a large role as the      patient may believe.  I will have him follow up in 1 week, so that      we can evaluate his ambulatory blood pressure monitoring and also      adjust his medications as necessary and as he is willing to allow      [place] to.  3. Question erectile dysfunction.  The patient has significant      concerns about his libido and his ability to maintain an erection.      At this point, we will adjust his medications and see if things      improve, but ultimately he may require addition of something like      Levitra or Cialis.  The patient actually is not interested in      adding more medication to his regimen and would prefer to avoid      such measure.  He does not use nitrates.   DISPOSITION:  As above, the patient will have an ambulatory blood  pressure monitoring.  We will readdress things and adjust his  medications from there.  With ongoing hypertension, we will have to  consider renal vascular ultrasound.      Nicolasa Ducking, ANP  Electronically Signed      Bevelyn Buckles. Bensimhon, MD  Electronically Signed   CB/MedQ  DD: 05/30/2008  DT: 05/31/2008  Job #: [id number]

## 2010-10-09 NOTE — Assessment & Plan Note (Signed)
[facility]                            CARDIOLOGY OFFICE NOTE   Alexander [name], Alexander [name]                       MRN:          [id number]  DATE:06/28/2008                            DOB:          06-05-1950    PRIMARY CARDIOLOGIST:  Veverly Fells. Excell Seltzer, MD   This is a 61 year old married white male patient who I saw in the clinic  last week for hypertension.  The patient has a lot of side effects from  medicines and claimed he has white coat syndrome and adjust his  medicines on his own.  His blood pressure was quite high last week when  I saw him and I increased his lisinopril from 20-40 mg and his  hydrochlorothiazide from 12.5 to 25.  He says the hydrochlorothiazide  made his arms and legs tingle and he had to cut it back to 12.5, but he  did continue the 40 of lisinopril.  Dr. Excell Seltzer has also tried to raise  his Coreg in the past, but he says he cannot tolerate any higher dose  than 12.5 b.i.d.  He did bring a list of blood pressures readings with  him today ranging from 120/70 to 143/92.   The patient has a history of coronary artery disease status post MI in  June 2008 treated with overlapping drug-eluting stents to the RCA with  moderate disease in the LAD and circumflex.  EF was 65%.   CURRENT MEDICATIONS:  1. Plavix 75 mg daily.  2. Protonix 40 mg daily.  3. Lipitor 40 mg daily.  4. Coreg 12.5 mg b.i.d.  5. Aspirin 81 mg daily.  6. Hydrochlorothiazide 12.5 mg daily.  7. Lisinopril 40 mg daily.   PHYSICAL EXAMINATION:  GENERAL:  This is a pleasant but anxious 57-year-  old white male in no acute distress.  VITAL SIGNS:  Blood pressure on arrival 170/100, when I retook it was  140/100.  Pulse 76, weight 184.  NECK:  Without JVD, HJR, bruit, or thyroid enlargement.  LUNGS:  Clear anterior, posterior, and lateral.  HEART:  Regular rate and rhythm at 80 beats per minute.  Normal S1 and  S2.  Distant heart sounds.  No murmur, rub, bruit, thrill,  or heave  noted.  ABDOMEN:  Obese.  Normoactive bowel sounds are heard throughout.  Soft  without organomegaly, masses, lesions, or abnormal tenderness.  EXTREMITIES:  Without cyanosis, clubbing, or edema.  He has good distal  pulses.   IMPRESSION:  1. Hypertension, white coat syndrome.  Readings from home with are      stable, but elevated here.  2. Coronary artery disease status post inferior wall myocardial      infarction in February 2008 treated with overlapping drug-eluting      stents to the right coronary artery.  He has residual left anterior      descending and circumflex disease, ejection fraction 65%.  3. Hyperlipidemia.  4. Ongoing tobacco abuse.  5. Stress and anxiety.  6. Medicine intolerance and noncompliance.   PLAN:  The patient's blood pressures seem to be stable at home.  I  am  going to leave his medications at the current doses as he does not seem  to tolerate increasing either the Coreg or the hydrochlorothiazide.  I  have asked him to continue monitoring his blood pressure at home and  bring them with him when he sees Dr. Excell Seltzer back in 3 months.      Alexander Reedy, PA-C  Electronically Signed      Alexander Abed, MD, [facility]  Electronically Signed   ML/MedQ  DD: 06/28/2008  DT: 06/28/2008  Job #: [id number]

## 2010-10-09 NOTE — Assessment & Plan Note (Signed)
[facility]                            CARDIOLOGY OFFICE NOTE   Alexander, [name]                       MRN:          [id number]  DATE:04/20/2008                            DOB:          1951-01-28    REASON FOR VISIT:  CAD.   HISTORY OF PRESENT ILLNESS:  Alexander [name] is a 60 year old gentleman with  coronary artery disease who had an inferior wall MI in June 2008.  He  was treated with overlapping drug-eluting stents to cover a severely  diseased right coronary artery lesion.  He was also noted to have  moderate disease in the LAD and left circumflex.  He has done very well  from symptomatic standpoint since his myocardial infarction.  His  followup LVEF was 60-65%.  He denies chest pain, dyspnea, edema,  orthopnea, PND, palpitations, lightheadedness, or syncope.  He continues  to smoke approximately one-half pack of cigarettes daily.  He stays  active, but is not engaged in regular exercise.  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  He always takes the stairs and tries to do as much  physical activity as possible.  He has been compliant with his medical  regimen.  He has been taking his blood pressure at work and at home, it  has been both up and [name] depending on his stress level.   MEDICATIONS:  1. Plavix 75 mg daily.  2. Protonix 40 mg daily.  3. Lipitor 40 mg daily.  4. Hydrochlorothiazide 12.5 mg daily.  5. Aspirin 81 mg daily.  6. Lisinopril 20 mg daily.  7. Carvedilol 12.5 mg twice daily.   ALLERGIES:  NKDA.   PHYSICAL EXAMINATION:  GENERAL:  The patient is alert and oriented.  He  is in no acute distress.  VITAL SIGNS:  Weight is 182 pounds, heart rate is 73, blood pressure  160/100, respiratory rate 16.  HEENT:  Normal.  NECK:  Normal carotid upstrokes.  No bruits.  JVP normal.  LUNGS:  Clear bilaterally.  HEART:  The apex is discrete and nondisplaced.  HEART:  Regular rate and rhythm.  No murmurs or gallops.  ABDOMEN:   Soft, nontender.  No organomegaly.  EXTREMITIES:  No clubbing, cyanosis, or edema.  Peripheral pulses are  intact and equal.  SKIN:  Warm and dry.  No rash.   EKG:  Normal sinus rhythm, within normal limits.  Heart rate 73 beats  per minute.   The catheter due to anemia.   ASSESSMENT:  1. Coronary artery disease status post myocardial infarction.  The      patient remains asymptomatic.  I would like to continue him on dual      antiplatelet therapy with aspirin and Plavix since he is tolerating      it well and his long-term bleeding risk is low.  We will perform an      exercise Myoview stress scan in the setting of his residual left      anterior descending and left circumflex disease to make sure that      these were not hemodynamically significant  lesions.  I am hopeful      this will be negative since he is able to maintain a good activity      level without angina.  See discussion below for secondary risk      reduction measures.  2. Hypertension.  I think his overall blood pressure control is      suboptimal.  He clearly has a component of white coat hypertension,      but it sounds like many of his outpatient readings are too high.  I      have asked him to increase his carvedilol to 25 mg twice daily and      continue on his hydrochlorothiazide and lisinopril.  3. Hyperlipidemia.  He is on atorvastatin 40 mg.  He is due for lipids      and LFTs.  We will check these labs when he comes in for his stress      test.   For followup, I would like to see Alexander [name] back in 6 months.     Veverly Fells. Excell Seltzer, MD  Electronically Signed    MDC/MedQ  DD: 04/20/2008  DT: 04/20/2008  Job #: [id number]

## 2010-10-09 NOTE — Cardiovascular Report (Signed)
Alexander [name], BROZ NO.:  [id number]   MEDICAL RECORD NO.:  [id number]          PATIENT TYPE:  INP   LOCATION:  2910                         FACILITY:  [facility]   PHYSICIAN:  Veverly Fells. Excell Seltzer, MD  DATE OF BIRTH:  [birth year]   DATE OF PROCEDURE:  DATE OF DISCHARGE:                            CARDIAC CATHETERIZATION   PROCEDURE:  Left heart catheterization, selective coronary angiography,  left ventricular angiography, aspiration thrombectomy, PTCA and drug-  eluting stent placement of the right coronary artery and Star close of  the right femoral artery.   INDICATIONS:  Mr. Verga is a 60 year old male with no real past  medical history who presents with back and chest pain and an acute  inferior MI on EKG.  He was brought emergently to the cardiac cath lab  for treatment.   Risks and indications of procedure were explained to the patient.  Informed consent was obtained.  The right groin was prepped, draped and  anesthetized with 1% lidocaine.  Using a modified Seldinger technique, a  6-French sheath was placed in the right femoral artery.  Multiple views  of the left coronary artery taken with a diagnostic 6-French JL-4  catheter.  Following imaging of the left coronary artery, a JR-4 guide  catheter was inserted, and images of the right coronary artery were  taken.  There was a 99% mid-right coronary stenosis with a large  thrombus burden.  I elected to intervene on this area.  Heparin and  Integrilin were used for anticoagulation.  Once a therapeutic ACT was  achieved, a cougar guidewire was passed into the distal right coronary  artery.  I initially attempted aspiration thrombectomy with a fetch  catheter, which was taken down to the mid-portion of the vessel.  I  connected the catheter crossed the lesion but used it proximal to the  lesion.  After aspiration, there was no real change in the appearance of  the vessel.  At that point, I inserted the 2.5 x 20  Maverick balloon,  which was inflated to 10 atmospheres on multiple inflations to pre-  dilate the lesion.  There was TIMI-II flow in the vessel at the  beginning of the procedure, and it remained in the TIMI II-III range.  I  elected to stent the long area of high-grade stenosis with apparent  thrombus with a 3.5 x 32 Taxus stent.  The stent was placed carefully  and deployed at 18 atmospheres.  Following stent placement, there was  still some significant disease at the proximal edge of the stent with a  hazy appearance.  I elected to overlap the second stent which was 3.5 x  12-mm Taxus.  This was also deployed at 18 atmospheres.  There was an  excellent angiographic appearance and very good stent expansion.  There  was TIMI-III flow throughout the vessel.  I elected to post-dilate the  entire area with 3.75 x 20-mm Quantum Maverick balloon, which was  inflated to 20 atmospheres on multiple inflations.  At the conclusion of  the procedure, there was excellent flow throughout the right coronary  artery, with no residual stenosis in the treated area and TIMI-III flow  in the vessel.   FINDINGS:  A Star close device was used to seal the femoral arteriotomy.   FINDINGS:  Aortic pressure 167/97 with a mean of 120.  Left ventricular  pressure is 169/16.   Left mainstem is angiographically normal and bifurcates into the LAD and  left circumflex.   The LAD is a large-caliber vessel that reaches the LV apex.  There is a  large first diagonal branch.  Just beyond the first diagonal, there is a  30% stenosis and then in the mid-LAD there is a 50% stenosis.  There are  no areas of high-grade stenosis.   The left circumflex is a medium-size vessel that courses down and  provides a medium-sized first OM branch, as well as a medium second OM  branch.  In the mid-circ between the two OM branches, there is an area  of 70% stenosis.  The circumflex continues to course down and supplies  one  posterolateral branch and reaches the distal AV groove.   The right coronary artery is severely diseased.  There is TIMI-II flow  in the vessel with a 99% irregular stenosis in the midportion, with  apparent thrombus throughout the midportion of the right coronary  artery.  The distal vessel has at least a 50% stenosis in the ostium of  the PDA, which is a diffusely diseased vessel, has a 75% stenosis.  There is also a large posterolateral branch that has a 50% stenosis in  its midportion.   Left ventricular function assessed by 30-degree RAO left  ventriculography, shows inferior akinesis with an estimated LVEF of 40%.   ASSESSMENT:  1. Inferior myocardial infarction secondary to thrombotic occlusion of      the right coronary artery.  2. Moderate LAD and left circumflex disease.  3. Moderate left ventricular dysfunction secondary to #1.   PLAN:  As detailed above, PCI was performed with an excellent  angiographic result.  The patient will require aspirin and clopidogrel  for a minimum of 12 months.  He will to be transferred to the CCU and  receive usual post MI care.      Veverly Fells. Excell Seltzer, MD  Electronically Signed     MDC/MEDQ  D:  11/13/2006  T:  11/14/2006  Job:  [id number]

## 2010-10-09 NOTE — H&P (Signed)
NAMEADAM, Alexander [name] NO.:  [id number]   MEDICAL RECORD NO.:  [id number]          PATIENT TYPE:  EMS   LOCATION:  MAJO                         FACILITY:  [facility]   PHYSICIAN:  Rollene Rotunda, MD, FACCDATE OF BIRTH:  01-25-1951   DATE OF ADMISSION:  11/13/2006  DATE OF DISCHARGE:                              HISTORY & PHYSICAL   PRIMARY:  None.   CARDIOLOGIST:  None.   REASON FOR PRESENTATION:  Chest and back pain.   HISTORY OF PRESENT ILLNESS:  The patient is a 60 year old white  gentleman without prior cardiac history.  He does not see physicians.  He reports chest discomfort starting on June 6.  This was after moving  some furniture.  He had an indigestion type discomfort followed by  diaphoresis.  He subsequently had pain waxing and waning over the next 2  weeks.  He had severe discomfort this Sunday.  He described it as a  knife-like discomfort in his chest.  However, he did not seek medical  attention.  He did not take anything for the pain.  He did not describe  radiation or associated symptoms.  This is unlike his previous reflux.  Finally, last night, he developed severe pressure in his back.  This was  unrelenting through most of the evening.  It was waxing and waning this  morning.  He finally presented to the emergency room where he was noted  to have 1-1/2 to 2 mm ST-segment elevation in the inferior leads with  slight ST depression and one in AVL.  The pain did resolve spontaneously  while speaking to him and in the emergency room.  He was not having any  associated symptoms, nausea, vomiting, diaphoresis.  He was moderately  hypertensive.   PAST MEDICAL HISTORY:  Again the patient does not see physicians.  Does  not have any history of hypertension, diabetes or hyperlipidemia.   PAST SURGICAL HISTORY:  None.   ALLERGIES:  None.   MEDICATIONS:  None.   SOCIAL HISTORY:  Patient is married.  He works at [employer].  He has  been smoking two  packs per day for 30 years.  He does not drink alcohol.   FAMILY HISTORY:  Noncontributory for early coronary artery disease,  sudden cardiac death, heart failure.   REVIEW OF SYSTEMS:  As stated in HPI.  Negative for all other systems.   PHYSICAL EXAMINATION:  GENERAL:  The patient was initially in mild  discomfort when I was talking to him, but currently is pain free.  VITAL SIGNS:  Blood pressure 194/113, heart rate 110 regular.  HEENT:  Eyes unremarkable.  Pupils equal and reactive to light.  Fundi  not visualized.  Oral mucosa normal.  NECK:  No jugular distension 45 degrees, carotid upstroke brisk and  symmetrical.  No bruits, thyromegaly.  LYMPHATICS:  No cervical, axillary, inguinal.  LUNGS:  Clear to auscultation bilaterally.  BACK:  No costovertebral this.  CHEST:  Unremarkable.  HEART:  PMI not displaced or sustained, S1-S2 within normal limits.  No  S3, no S4, clicks, rubs, murmurs.  ABDOMEN:  Obese, positive bowel sounds.  Normal in frequency, pitch.  No  bruits, rebound, guarding.  No midline pulsatile mass.  No  hepatosplenomegaly.  SKIN:  No rashes.  No nodules.  EXTREMITIES:  Pulses 2+.  No edema, cyanosis, clubbing.  NEUROLOGICAL:  Oriented to person, place and time.  Cranial nerves II-  XII grossly intact, motor grossly intact.   STUDIES:  EKG sinus tachycardia, rate 103, axis, rightward, poor  anterior R-wave progression, 1 to 1-1.5 mm ST-segment elevation , 3 in  AVF with slight ST depression, one in AVL.   LABORATORY DATA:  Labs pending.   ASSESSMENT/PLAN:  1. Chest: The patient had chest and back discomfort consistent with      unstable angina.  He has significant cardiovascular risk factors      with his tobacco.  He has ST-segment elevation acutely as EKG.      This appears to be acute inferior myocardial infarction.  I have      discussed in detail cardiac catheterization.  He and his wife      understand the risk of death, stroke, heart attack,  infection,      bleeding, bruising, vascular trauma, embolism, renal insufficiency,      dye allergy and other.  They agreed to proceed.  They have been      taken urgently to cardiac catheterization lab.  He has been given      aspirin.  2. Tobacco: The patient will be educated about the need to stop      smoking.Marland Kitchen  He will probably need anxiolytics and nicotine patch.  3. Risk reduction: Will check a lipid profile.  4. Hypertension: Blood pressure is elevated, and we will manage this      in the context of treating his acute pain and coronary syndrome.      Rollene Rotunda, MD, [facility]  Electronically Signed     JH/MEDQ  D:  11/13/2006  T:  11/14/2006  Job:  [id number]

## 2010-10-09 NOTE — Discharge Summary (Signed)
NAMENELS, MUNN NO.:  [id number]   MEDICAL RECORD NO.:  [id number]          PATIENT TYPE:  INP   LOCATION:  2928                         FACILITY:  [facility]   PHYSICIAN:  Rollene Rotunda, MD, FACCDATE OF BIRTH:  25-Jun-1950   DATE OF ADMISSION:  11/13/2006  DATE OF DISCHARGE:  11/16/2006                               DISCHARGE SUMMARY   PROCEDURES:  1. Cardiac catheterization.  2. Coronary arteriogram.  3. Left ventriculogram.  4. Percutaneous transluminal cardiac angioplasty and drug-eluting      stent x2 to one vessel.Marland Kitchen   PRIMARY DIAGNOSES:  Inferior ST elevation myocardial infarction ,  treated with percutaneous transluminal cardiac angioplasty and TAXUS  stent x2 to the right coronary artery.   SECONDARY DIAGNOSES:  1. Residual coronary artery disease in the circumflex of 70%, distal      right coronary artery and posterior descending artery at 50-75%,      left anterior descending  30 and 50%, medical therapy recommended.  2. Ischemic cardiomyopathy with an ejection fraction of 40% at      catheterization.  3. Dyslipidemia with a total cholesterol 186, triglycerides 177, HDL      34, LDL 117.  4. Ongoing tobacco use. The patient was strongly advised to quit.   TIME AT DISCHARGE:  43 minutes.   HOSPITAL COURSE:  Mr. Goodenow is a 60 year old male with no previous  history of coronary artery disease.  He came to the hospital on November 13, 2006, for chest and back pain that started on June 6.  This discomfort  started after exertion and waxed and waned and increased slightly on the  day of admission to the point that he came to the hospital.  His EKG  showed inferior ST elevation, and he was taken urgently to the  catheterization lab.   His cardiac catheterization showed the culprit lesions to be an 80% and  90% in the RCA treated with overlapping TAXUS stents, increasing blood  flow in reducing each stenosis to zero.  His EF was 40% with inferior  akinesis.  He is to be on aspirin and Plavix for 12 months and then  aspirin indefinitely.   He was started on Lipitor 80 mg a day and low-dose beta blocker.  He  tolerated his medications well.  He was seen by cardiac rehab and  ambulated without chest pain or shortness of breath.  He was referred to  outpatient rehab.  Of note, he was in ADAPT drug-eluting stent trial.   Mr. Quinley steadily improved.  His labs were checked and include the  results described above.  His white count was mildly elevated on  admission, but this resolved.  His blood sugar was also slightly  elevated between 111 and 170.  Fasting blood sugars are not consistently  elevated, and he is encouraged to eat a heart healthy diet.   By November 16, 2006, Mr. Gibbons was ambulating without chest pain or  shortness of breath and considered stable for discharge.   DISCHARGE INSTRUCTIONS:  1. His activity level is to be increased gradually.  2. He is not to do any lifting for 2 weeks and no driving for 2 days.  3. He is to call our office for any problems with the catheterization      site.  4. He is not to use tobacco.  5. He is to follow up with Dr. Excell Seltzer, and our office will call.  6. He is encouraged to stick to a low-sodium, heart healthy diet.  7. He is encouraged to find a family physician.   DISCHARGE MEDICATIONS:  1. Coated aspirin 325 mg daily.  2. Plavix 75 mg daily.  3. Lipitor 80 mg daily.  4. Coreg 6.25 mg b.i.d.  5. Nitroglycerin sublingual p.r.n.  6. Protonix 40 mg daily.      Theodore Demark, PA-C      Rollene Rotunda, MD, [facility]  Electronically Signed    RB/MEDQ  D:  11/16/2006  T:  11/16/2006  Job:  [id number]

## 2010-10-31 ENCOUNTER — Telehealth: Payer: Self-pay | Admitting: Cardiovascular Disease

## 2010-10-31 ENCOUNTER — Other Ambulatory Visit: Payer: Self-pay | Admitting: *Deleted

## 2010-10-31 MED ORDER — CLOPIDOGREL BISULFATE 75 MG PO TABS: 75.0000 mg | ORAL_TABLET | Freq: Every day | ORAL | Status: DC

## 2010-10-31 NOTE — Telephone Encounter (Signed)
Spoke with patients wife she states her husband plavix was denied and she seen a faxed copy of the deni el when she went to the pharmacy i called the pharmacy and spoke with a pharmacist they state there was no fax it was e prescribed they state they cannot tell who sent it but it came from our office yesterday i looked in epic i see no denial i gave pt 1 year supply of plavix. Wife wanted to speak to office manager i transferred her over to Fabio Neighbors everything was taken care of.

## 2010-10-31 NOTE — Telephone Encounter (Signed)
Pt has been trying to get Plavix filled since last week.  The phar told her it had been denied by [place].  The wife is extremely upset that this has taken so long and would like some answers.  The phar is [facility] [address].  He has no medication left!!!!!

## 2010-11-21 ENCOUNTER — Other Ambulatory Visit: Payer: Self-pay | Admitting: Cardiovascular Disease

## 2010-12-24 ENCOUNTER — Telehealth: Payer: Self-pay | Admitting: Cardiovascular Disease

## 2010-12-24 ENCOUNTER — Other Ambulatory Visit: Payer: Self-pay | Admitting: Cardiovascular Disease

## 2010-12-24 NOTE — Telephone Encounter (Signed)
Alexander [name] called, unhappy with fact that she had called with an urgent refill request for Alexander [name].

## 2010-12-24 NOTE — Telephone Encounter (Signed)
pantoprazole 40 mg. [facility] [address] [phone].

## 2010-12-28 NOTE — Telephone Encounter (Signed)
Received fax request for refill. [facility] called [facility] [address] and confirmed they have refills for medication and it is being prepared for pt. I called to give pt this information but there was no answer and no voicemail

## 2011-02-11 ENCOUNTER — Other Ambulatory Visit: Payer: Self-pay | Admitting: Cardiovascular Disease

## 2011-02-19 LAB — POCT I-STAT, CHEM 8
Chloride: 103
HCT: 46
Hemoglobin: 15.6
Potassium: 4
Sodium: 141

## 2011-03-12 ENCOUNTER — Other Ambulatory Visit: Payer: Self-pay | Admitting: Cardiovascular Disease

## 2011-03-13 LAB — TROPONIN I: Troponin I: 4.81

## 2011-03-13 LAB — BASIC METABOLIC PANEL
CO2: 27
CO2: 28
Calcium: 8.5
Chloride: 103
Chloride: 103
GFR calc Af Amer: >60
GFR calc Af Amer: >60
Potassium: 3.6
Potassium: 4.1
Sodium: 137
Sodium: 138

## 2011-03-13 LAB — LIPID PANEL
Cholesterol: 186
LDL Cholesterol: 117 — ABNORMAL HIGH
Triglycerides: 177 — ABNORMAL HIGH

## 2011-03-13 LAB — CBC
HCT: 41.5
HCT: 43.9
HCT: 53.5 — ABNORMAL HIGH
Hemoglobin: 14.2
Hemoglobin: 14.9
Hemoglobin: 15.8
Hemoglobin: 17.8 — ABNORMAL HIGH
MCHC: 33.5
MCHC: 33.9
MCHC: 34.1
MCV: 86.4
Platelets: 309
RBC: 4.87
RBC: 5.08
RBC: 6.19 — ABNORMAL HIGH
RDW: 13.2
WBC: 11.8 — ABNORMAL HIGH

## 2011-03-13 LAB — POCT I-STAT CREATININE
Creatinine, Ser: 0.9
Operator id: 146091

## 2011-03-13 LAB — COMPREHENSIVE METABOLIC PANEL
Alkaline Phosphatase: 81
BUN: 7
Chloride: 105
Creatinine, Ser: 0.84
Glucose, Bld: 112 — ABNORMAL HIGH
Potassium: 4
Total Bilirubin: 1.1

## 2011-03-13 LAB — CK TOTAL AND CKMB (NOT AT ARMC)
CK, MB: 46.5 — ABNORMAL HIGH
CK, MB: 77.4 — ABNORMAL HIGH
Relative Index: 10.9 — ABNORMAL HIGH
Relative Index: 12.1 — ABNORMAL HIGH
Relative Index: 13.1 — ABNORMAL HIGH
Total CK: 342 — ABNORMAL HIGH

## 2011-03-13 LAB — I-STAT 8, (EC8 V) (CONVERTED LAB)
BUN: 8
Bicarbonate: 25.6 — ABNORMAL HIGH
HCT: 57 — ABNORMAL HIGH
Hemoglobin: 19.4 — ABNORMAL HIGH
Operator id: 146091
TCO2: 27
pCO2, Ven: 42.9 — ABNORMAL LOW

## 2011-03-13 LAB — POCT CARDIAC MARKERS: CKMB, poc: 4.9

## 2011-03-13 LAB — APTT: aPTT: 30

## 2011-03-13 LAB — PROTIME-INR: INR: 1

## 2011-04-04 ENCOUNTER — Ambulatory Visit (INDEPENDENT_AMBULATORY_CARE_PROVIDER_SITE_OTHER): Payer: 59 | Admitting: Cardiovascular Disease

## 2011-04-04 ENCOUNTER — Encounter: Payer: Self-pay | Admitting: Cardiovascular Disease

## 2011-04-04 DIAGNOSIS — I10 Essential (primary) hypertension: Secondary | ICD-10-CM

## 2011-04-04 DIAGNOSIS — E785 Hyperlipidemia, unspecified: Secondary | ICD-10-CM

## 2011-04-04 DIAGNOSIS — I2510 Atherosclerotic heart disease of native coronary artery without angina pectoris: Secondary | ICD-10-CM

## 2011-04-04 DIAGNOSIS — I252 Old myocardial infarction: Secondary | ICD-10-CM

## 2011-04-04 DIAGNOSIS — E7800 Pure hypercholesterolemia, unspecified: Secondary | ICD-10-CM

## 2011-04-04 NOTE — Patient Instructions (Signed)
Your physician wants you to follow-up in: 1 YEAR. You will receive a reminder letter in the mail two months in advance. If you don't receive a letter, please call our office to schedule the follow-up appointment.  Your physician recommends that you return for a FASTING lipid, liver, CBC and BMP in May 2013. Please call the office to schedule these lab closer to May of 2013.    Your physician recommends that you continue on your current medications as directed. Please refer to the Current Medication list given to you today.

## 2011-04-04 NOTE — Assessment & Plan Note (Signed)
Lipids have been at goal. Her last lipid panel was reviewed from May 2012. He is tolerating atorvastatin 40 mg daily and will have followup lipids in May 2013.

## 2011-04-04 NOTE — Assessment & Plan Note (Signed)
The patient is stable. He has minimal angina that resolved with continued exercise. This is unchanged over time. He is on a good medical program that includes a statin for lipid lowering, dual antiplatelet therapy with aspirin Plavix, and the beta blocker and ACE inhibitor. He will continue his current medications and followup in one year

## 2011-04-04 NOTE — Assessment & Plan Note (Signed)
Office blood pressure remains markedly elevated. He seems to have extreme whitecoat hypertension. I have tried increasing his carvedilol in the past. I have also tried adding hydrochlorothiazide. He was intolerant of both of these medicine adjustments because of marked fatigue and low blood pressures at home. Will continue his current meds.

## 2011-04-04 NOTE — Progress Notes (Signed)
HPI:  This is a 60 year old gentleman presenting for followup evaluation. He has coronary artery disease and initially presented in 2008 with an inferior wall MI. He had critical stenosis of the right coronary artery and was treated with overlapping drug-eluting stents. He has had no recurrent ischemic events since his initial presentation. He has been maintained on long-term aspirin and Plavix. He was treated with first generation drug-eluting stents (Taxus). He continues to smoke cigarettes and is not interested in quitting. He always has elevated blood pressures at his office readings but reports good readings at home. I have tried to increase his antihypertensive medications that he has been completely intolerant to med titration.  The patient is doing well from a symptomatic perspective. He has occasional chest pain when he first starts his walk, but this resolved as he continues to walk. He's had this for several years and it is unchanged. He does hard physical work and has no symptoms of chest pain or pressure with heavy exertion. He denies dyspnea, edema, or palpitations. He denies leg edema. He brings in a blood pressure reading from his work when his blood pressure was 128/88.  Outpatient Encounter Prescriptions as of 04/04/2011  Medication Sig Dispense Refill  . aspirin 81 MG tablet Take 81 mg by mouth daily.        Marland Kitchen atorvastatin (LIPITOR) 40 MG tablet TAKE ONE TABLET BY MOUTH DAILY.  30 tablet  5  . carvedilol (COREG) 12.5 MG tablet TAKE 1 TABLET TWICE DAILY  60 tablet  6  . clopidogrel (PLAVIX) 75 MG tablet Take 1 tablet (75 mg total) by mouth daily.  30 tablet  12  . lisinopril (PRINIVIL,ZESTRIL) 40 MG tablet TAKE 1 TABLET BY MOUTH EVERY DAY  30 tablet  9  . pantoprazole (PROTONIX) 40 MG tablet TAKE 1 TABLET EVERY DAY  90 tablet  1  . nitroGLYCERIN (NITROSTAT) 0.4 MG SL tablet Place 0.4 mg under the tongue every 5 (five) minutes as needed.          Allergies  Allergen Reactions  .  Carvedilol     REACTION: can only take low doses  . Hydrochlorothiazide Other (See Comments)    Leg numbness.    Past Medical History  Diagnosis Date  . Myocardial infarct      MYOCARDIAL INFARCTION, INFERIOR WALL, INITIAL EPISODE (ICD-410.41)RX OVERLAPPING  DRUG-ELUDTING STENTS RCA WITH RESIDUAL DISEASE LAD & CFX. EF INITIALLY 40% NOW 60-65%  . Hyperlipidemia   . HTN (hypertension)   . CAD (coronary artery disease)   . Anxiety   . GERD (gastroesophageal reflux disease)     ROS: Negative except as per HPI  BP 160/110  Pulse 88  Resp 18  Ht 5\' 5"  (1.651 m)  Wt 79.434 kg (175 lb 1.9 oz)  BMI 29.14 kg/m2  PHYSICAL EXAM: Pt is alert and oriented, NAD HEENT: normal Neck: JVP - normal, carotids 2+= without bruits Lungs: CTA bilaterally CV: RRR without murmur or gallop Abd: soft, NT, Positive BS, no hepatomegaly Ext: no C/C/E, distal pulses intact and equal Skin: warm/dry no rash  EKG:  Normal sinus rhythm, within normal limits. Heart rate 71 beats per minute.  ASSESSMENT AND PLAN:

## 2011-05-15 ENCOUNTER — Other Ambulatory Visit: Payer: Self-pay | Admitting: Cardiovascular Disease

## 2011-06-25 ENCOUNTER — Other Ambulatory Visit: Payer: Self-pay | Admitting: Cardiovascular Disease

## 2011-10-14 ENCOUNTER — Other Ambulatory Visit: Payer: Self-pay | Admitting: Cardiovascular Disease

## 2011-11-19 ENCOUNTER — Other Ambulatory Visit: Payer: Self-pay | Admitting: Cardiovascular Disease

## 2011-11-27 ENCOUNTER — Other Ambulatory Visit: Payer: Self-pay | Admitting: Cardiovascular Disease

## 2012-01-02 ENCOUNTER — Telehealth: Payer: Self-pay | Admitting: Cardiovascular Disease

## 2012-01-02 NOTE — Telephone Encounter (Signed)
Attempted to phone pt--no answer--LM on voice mail that i would pass message along to dr cooper, but at this point , until we talk with dr cooper we cannot change med--nt

## 2012-01-02 NOTE — Telephone Encounter (Signed)
Pt having poss side effect from med, carvedilol , hands goes numb when holding on to something, tingles at times, going on for about a year, looked up webmd and saw these side effects for carvedilol

## 2012-01-02 NOTE — Telephone Encounter (Signed)
F/u   Pt called for f/u status..... I let him know Dr. Excell Seltzer is set to review Med to advise on this matter.  Told patient he will get a call when that decision has been made.  Meka

## 2012-01-15 NOTE — Telephone Encounter (Signed)
lmtcb

## 2012-01-28 NOTE — Telephone Encounter (Signed)
Dr Excell Seltzer attempted to contact the pt but the pt has not called back.  Encounter will be closed.

## 2012-02-18 ENCOUNTER — Other Ambulatory Visit: Payer: Self-pay | Admitting: Cardiovascular Disease

## 2012-04-03 ENCOUNTER — Ambulatory Visit: Payer: 59 | Admitting: Cardiovascular Disease

## 2012-04-16 ENCOUNTER — Ambulatory Visit: Payer: 59 | Admitting: Cardiovascular Disease

## 2012-04-17 ENCOUNTER — Telehealth: Payer: Self-pay | Admitting: Cardiovascular Disease

## 2012-04-17 NOTE — Telephone Encounter (Signed)
New problem:    At dentist office now - for an extraction need to hold plavix.

## 2012-04-17 NOTE — Telephone Encounter (Signed)
I spoke with Judeth Cornfield at Dr Keturah Barre office and the pt is currently in the chair and needs two teeth extracted.  The pt is on Plavix and ASA.  Dr Bradly Chris would like to know if the pt can hold plavix prior to extraction. Per Dr Excell Seltzer he prefers that the pt not hold plavix prior to extraction. Stephanie aware of Dr Earmon Phoenix instructions.

## 2012-04-28 ENCOUNTER — Ambulatory Visit (INDEPENDENT_AMBULATORY_CARE_PROVIDER_SITE_OTHER): Payer: 59 | Admitting: Cardiovascular Disease

## 2012-04-28 ENCOUNTER — Encounter: Payer: Self-pay | Admitting: Cardiovascular Disease

## 2012-04-28 VITALS — BP 164/100 | HR 72 | Ht 65.0 in | Wt 174.0 lb

## 2012-04-28 DIAGNOSIS — E785 Hyperlipidemia, unspecified: Secondary | ICD-10-CM

## 2012-04-28 DIAGNOSIS — I10 Essential (primary) hypertension: Secondary | ICD-10-CM

## 2012-04-28 LAB — COMPREHENSIVE METABOLIC PANEL
Albumin: 4.3 g/dL (ref 3.5–5.2)
Alkaline Phosphatase: 79 U/L (ref 39–117)
BUN: 11 mg/dL (ref 6–23)
Calcium: 8.8 mg/dL (ref 8.4–10.5)
Creatinine, Ser: 0.9 mg/dL (ref 0.4–1.5)
Glucose, Bld: 120 mg/dL — ABNORMAL HIGH (ref 70–99)
Potassium: 4.1 mEq/L (ref 3.5–5.1)

## 2012-04-28 LAB — LIPID PANEL
Cholesterol: 120 mg/dL (ref 0–200)
Triglycerides: 165 mg/dL — ABNORMAL HIGH (ref 0.0–149.0)

## 2012-04-28 NOTE — Progress Notes (Signed)
HPI:  61 year old gentleman presenting for followup evaluation. The patient has coronary artery disease after initially presenting in 2008 with an inferior wall MI. He was treated with overlapping Taxus drug-eluting stents in the right coronary artery. He's been maintained on long-term dual antiplatelet therapy with aspirin and Plavix. Blood pressure has consistently been elevated at the time of his office visits, but he has had white coat hypertension. I have tried to titrate his antihypertensive medications but he is always been intolerant to this. Last lipids were checked in May 2012 with a cholesterol of 128, HDL 31, and LDL 58.  The patient is doing well from a symptomatic perspective. He denies chest pain, chest pressure, dyspnea, palpitations, lightheadedness, or syncope. He's been compliant with his medications. He continues to smoke cigarettes and is not interested in quitting.  Outpatient Encounter Prescriptions as of 04/28/2012  Medication Sig Dispense Refill  . aspirin 81 MG tablet Take 81 mg by mouth daily.        Marland Kitchen atorvastatin (LIPITOR) 40 MG tablet TAKE ONE TABLET BY MOUTH DAILY.  30 tablet  5  . carvedilol (COREG) 12.5 MG tablet TAKE 1 TABLET TWICE DAILY  60 tablet  3  . clopidogrel (PLAVIX) 75 MG tablet TAKE 1 TABLET BY MOUTH EVERY DAY  30 tablet  6  . lisinopril (PRINIVIL,ZESTRIL) 40 MG tablet TAKE 1 TABLET BY MOUTH EVERY DAY  30 tablet  9  . nitroGLYCERIN (NITROSTAT) 0.4 MG SL tablet Place 0.4 mg under the tongue every 5 (five) minutes as needed.        . pantoprazole (PROTONIX) 40 MG tablet TAKE 1 TABLET BY MOUTH EVERY DAY  90 tablet  6    Allergies  Allergen Reactions  . Carvedilol     REACTION: can only take low doses  . Hydrochlorothiazide Other (See Comments)    Leg numbness.    Past Medical History  Diagnosis Date  . Myocardial infarct      MYOCARDIAL INFARCTION, INFERIOR WALL, INITIAL EPISODE (ICD-410.41)RX OVERLAPPING  DRUG-ELUDTING STENTS RCA WITH RESIDUAL  DISEASE LAD & CFX. EF INITIALLY 40% NOW 60-65%  . Hyperlipidemia   . HTN (hypertension)   . CAD (coronary artery disease)   . Anxiety   . GERD (gastroesophageal reflux disease)     ROS: Negative except as per HPI  BP 164/100  Pulse 72  Ht 5\' 5"  (1.651 m)  Wt 78.926 kg (174 lb)  BMI 28.96 kg/m2  SpO2 97%  PHYSICAL EXAM: Pt is alert and oriented, NAD HEENT: normal Neck: JVP - normal, carotids 2+= without bruits Lungs: CTA bilaterally CV: RRR without murmur or gallop Abd: soft, NT, Positive BS, no hepatomegaly Ext: no C/C/E, distal pulses intact and equal Skin: warm/dry no rash  EKG:  Normal sinus rhythm 72 beats per minute, within normal limits.  ASSESSMENT AND PLAN: 1. Coronary artery disease, native vessel. The patient is maintained on dual antiplatelet therapy with aspirin and Plavix after being treated with first generation drug-eluting stents. Will continue current medical program.  2. Essential hypertension. There is clearly a component of whitecoat hypertension here. He has not tolerated upward titration of his medications. I've asked him to obtain some outpatient blood pressure readings when he goes to the pharmacy. He will continue on carvedilol and lisinopril.  3. Hyperlipidemia. Lipids were reviewed as above. The patient is on high intensity statin therapy. Will continue the same. Lipids and LFTs will be drawn today.  Tonny Bollman 04/28/2012 2:21 PM

## 2012-04-28 NOTE — Patient Instructions (Addendum)
**Note De-identified Eryck Negron Obfuscation** Your physician recommends that you return for lab work in: today  Your physician wants you to follow-up in: 1 year. You will receive a reminder letter in the mail two months in advance. If you don't receive a letter, please call our office to schedule the follow-up appointment.   

## 2012-06-03 ENCOUNTER — Other Ambulatory Visit: Payer: Self-pay | Admitting: Cardiovascular Disease

## 2012-06-22 ENCOUNTER — Other Ambulatory Visit: Payer: Self-pay | Admitting: Cardiovascular Disease

## 2012-06-22 ENCOUNTER — Telehealth: Payer: Self-pay | Admitting: Cardiovascular Disease

## 2012-06-22 NOTE — Telephone Encounter (Signed)
Pt's carvedilol refill need auth , requested Friday, pt needs asap alomost out and going out of town, pls call [facility] and [facility]

## 2012-06-22 NOTE — Telephone Encounter (Signed)
Pharmacist at [facility] states that they just needed a new RX to refill pts Carvedilol not an authorization and that they received refill from our office today.

## 2012-06-22 NOTE — Telephone Encounter (Signed)
I called pts wife, Luster Landsberg, to explain to her that Dr. Excell Seltzer and his nurse is out of the office today and that I am going to work on getting pts prior authorization for Carvedilol. Luster Landsberg stated that "the paperwork was faxed to your office on Friday (06/19/12) at 5:05 and y'all have had more than enough time to get my husbands medications. She stated "he only has 2 pills left so you tell me where is he going to get his medicine". I attempted to explain to her that it takes time to get prior authorization and that this is the first Ive heard about it and that I am working on it. She said"you wait a minute and let me talk, this is not the first time this has happended and I am tired of it". "Dont you have anyone in that office that can fill in for Dr. Excell Seltzer and his nurse when they are not in office". When I tried to answer her that I am working on it she again interrupted me and said "you let me speak". I then offered to have my manager Gina call her and she stated "yes and believe me this is not the first time". I advised her that my manager will call her back concerning pts prior auth for his Carvedilol.

## 2012-06-25 ENCOUNTER — Other Ambulatory Visit: Payer: Self-pay

## 2012-06-25 ENCOUNTER — Other Ambulatory Visit: Payer: Self-pay | Admitting: Cardiovascular Disease

## 2012-07-08 ENCOUNTER — Other Ambulatory Visit: Payer: Self-pay | Admitting: Cardiovascular Disease

## 2012-10-05 ENCOUNTER — Other Ambulatory Visit: Payer: Self-pay | Admitting: Cardiovascular Disease

## 2012-10-16 ENCOUNTER — Other Ambulatory Visit: Payer: Self-pay | Admitting: Cardiovascular Disease

## 2012-12-08 ENCOUNTER — Other Ambulatory Visit: Payer: Self-pay | Admitting: Cardiovascular Disease

## 2013-01-19 ENCOUNTER — Other Ambulatory Visit: Payer: Self-pay | Admitting: Cardiovascular Disease

## 2013-01-21 ENCOUNTER — Other Ambulatory Visit: Payer: Self-pay | Admitting: Cardiovascular Disease

## 2013-02-19 ENCOUNTER — Other Ambulatory Visit: Payer: Self-pay | Admitting: Cardiovascular Disease

## 2013-05-10 ENCOUNTER — Other Ambulatory Visit: Payer: Self-pay | Admitting: Cardiovascular Disease

## 2013-05-13 ENCOUNTER — Ambulatory Visit (INDEPENDENT_AMBULATORY_CARE_PROVIDER_SITE_OTHER): Payer: 59 | Admitting: Cardiovascular Disease

## 2013-05-13 ENCOUNTER — Encounter (INDEPENDENT_AMBULATORY_CARE_PROVIDER_SITE_OTHER): Payer: Self-pay

## 2013-05-13 ENCOUNTER — Encounter: Payer: Self-pay | Admitting: Cardiovascular Disease

## 2013-05-13 VITALS — BP 174/96 | HR 75 | Ht 65.0 in | Wt 182.0 lb

## 2013-05-13 DIAGNOSIS — I2510 Atherosclerotic heart disease of native coronary artery without angina pectoris: Secondary | ICD-10-CM

## 2013-05-13 DIAGNOSIS — I10 Essential (primary) hypertension: Secondary | ICD-10-CM

## 2013-05-13 DIAGNOSIS — E785 Hyperlipidemia, unspecified: Secondary | ICD-10-CM

## 2013-05-13 NOTE — Progress Notes (Signed)
    HPI:  62 year old gentleman presenting for followup evaluation. The patient is followed for coronary artery disease, hypertension, and hyperlipidemia. He has tolerated long-term dual antiplatelet therapy with aspirin and Plavix. He has long-standing whitecoat hypertension.  He is doing well. He has no complaints of chest pain, chest pressure, dyspnea, edema, or palpitations. He's had no leg swelling. He remains physically active. He has not engaged in any regular exercise. He continues to smoke cigarettes. He is compliant with his medications. Blood pressures have been in the 120s and 130s at home.  Outpatient Encounter Prescriptions as of 05/13/2013  Medication Sig  . aspirin 81 MG tablet Take 81 mg by mouth daily.    Marland Kitchen atorvastatin (LIPITOR) 40 MG tablet TAKE ONE TABLET BY MOUTH DAILY.  . carvedilol (COREG) 12.5 MG tablet TAKE 1 TABLET BY MOUTH TWICE A DAY  . clopidogrel (PLAVIX) 75 MG tablet TAKE 1 TABLET BY MOUTH EVERY DAY  . lisinopril (PRINIVIL,ZESTRIL) 40 MG tablet TAKE 1 TABLET BY MOUTH EVERY DAY  . nitroGLYCERIN (NITROSTAT) 0.4 MG SL tablet Place 0.4 mg under the tongue every 5 (five) minutes as needed.    . pantoprazole (PROTONIX) 40 MG tablet TAKE 1 TABLET BY MOUTH EVERY DAY    Allergies  Allergen Reactions  . Carvedilol     REACTION: can only take low doses  . Hydrochlorothiazide Other (See Comments)    Leg numbness.    Past Medical History  Diagnosis Date  . Myocardial infarct      MYOCARDIAL INFARCTION, INFERIOR WALL, INITIAL EPISODE (ICD-410.41)RX OVERLAPPING  DRUG-ELUDTING STENTS RCA WITH RESIDUAL DISEASE LAD & CFX. EF INITIALLY 40% NOW 60-65%  . Hyperlipidemia   . HTN (hypertension)   . CAD (coronary artery disease)   . Anxiety   . GERD (gastroesophageal reflux disease)     ROS: Negative except as per HPI  BP 174/96  Pulse 75  Ht 5\' 5"  (1.651 m)  Wt 182 lb (82.555 kg)  BMI 30.29 kg/m2  PHYSICAL EXAM: Pt is alert and oriented, NAD HEENT: normal Neck:  JVP - normal, carotids 2+= without bruits Lungs: CTA bilaterally CV: RRR without murmur or gallop Abd: soft, NT, Positive BS, no hepatomegaly Ext: no C/C/E, distal pulses intact and equal Skin: warm/dry no rash  EKG:  Normal sinus rhythm 75 beats per minute, age indeterminate inferior infarct, otherwise within normal limits.  ASSESSMENT AND PLAN: 1. Coronary artery disease, native vessel. The patient is stable without symptoms of angina. I am going to continue his same medical regimen. I asked him to record some home blood pressures to be certain that his control is okay.  2. Hypertension. As above, will continue carvedilol and lisinopril. Check and record home blood pressures. He can feel his blood pressure rising when he is sitting in our waiting area. He has always had whitecoat hypertension. I tried to titrate his medications and he was unable to tolerate higher doses.  3. Hyperlipidemia. The patient is on atorvastatin. One year ago his cholesterol is 120, HDL 34, and LDL 53. Will repeat labs and continue his current medications.  For followup I will see him back in one year. Tobacco cessation counseling was done. I don't think he is interested in quitting.  Tonny Bollman 05/13/2013 10:41 AM

## 2013-05-13 NOTE — Patient Instructions (Signed)
Your physician wants you to follow-up in: 1 YEAR with Dr Excell Seltzer.  You will receive a reminder letter in the mail two months in advance. If you don't receive a letter, please call our office to schedule the follow-up appointment.  Your physician recommends that you return for a FASTING LIPID, LIVER and BMP--nothing to eat or drink after midnight, lab opens at 7:30 AM (06/01/2013)  Your physician recommends that you continue on your current medications as directed. Please refer to the Current Medication list given to you today.

## 2013-05-18 ENCOUNTER — Other Ambulatory Visit: Payer: Self-pay

## 2013-05-18 MED ORDER — CLOPIDOGREL BISULFATE 75 MG PO TABS: ORAL_TABLET | ORAL | Status: DC

## 2013-06-01 ENCOUNTER — Other Ambulatory Visit: Payer: 59

## 2013-06-08 ENCOUNTER — Other Ambulatory Visit (INDEPENDENT_AMBULATORY_CARE_PROVIDER_SITE_OTHER): Payer: 59

## 2013-06-08 DIAGNOSIS — I2510 Atherosclerotic heart disease of native coronary artery without angina pectoris: Secondary | ICD-10-CM

## 2013-06-08 DIAGNOSIS — E785 Hyperlipidemia, unspecified: Secondary | ICD-10-CM

## 2013-06-08 DIAGNOSIS — I10 Essential (primary) hypertension: Secondary | ICD-10-CM

## 2013-06-08 LAB — LDL CHOLESTEROL, DIRECT: Direct LDL: 48.6 mg/dL

## 2013-06-08 LAB — LIPID PANEL
CHOLESTEROL: 116 mg/dL (ref 0–200)
HDL: 32.2 mg/dL — AB (ref >39.00)
Total CHOL/HDL Ratio: 4
Triglycerides: 247 mg/dL — ABNORMAL HIGH (ref 0.0–149.0)
VLDL: 49.4 mg/dL — ABNORMAL HIGH (ref 0.0–40.0)

## 2013-06-08 LAB — BASIC METABOLIC PANEL
BUN: 12 mg/dL (ref 6–23)
CALCIUM: 8.6 mg/dL (ref 8.4–10.5)
CO2: 28 mEq/L (ref 19–32)
Chloride: 104 mEq/L (ref 96–112)
Creatinine, Ser: 1 mg/dL (ref 0.4–1.5)
GFR: 83.19 mL/min (ref >60.00)
Glucose, Bld: 108 mg/dL — ABNORMAL HIGH (ref 70–99)
POTASSIUM: 4.5 meq/L (ref 3.5–5.1)
Sodium: 138 mEq/L (ref 135–145)

## 2013-06-08 LAB — HEPATIC FUNCTION PANEL
ALBUMIN: 3.9 g/dL (ref 3.5–5.2)
ALT: 21 U/L (ref 0–53)
AST: 12 U/L (ref 0–37)
Alkaline Phosphatase: 79 U/L (ref 39–117)
BILIRUBIN DIRECT: 0.1 mg/dL (ref 0.0–0.3)
Total Bilirubin: 0.6 mg/dL (ref 0.3–1.2)
Total Protein: 7.1 g/dL (ref 6.0–8.3)

## 2013-06-14 ENCOUNTER — Other Ambulatory Visit: Payer: Self-pay | Admitting: Cardiovascular Disease

## 2013-06-14 ENCOUNTER — Telehealth: Payer: Self-pay | Admitting: *Deleted

## 2013-06-14 NOTE — Telephone Encounter (Signed)
Patients wife called to ask when we were going to refill the medications requested from the pharmacy for her husband. She stated that once again we have failed to respond to their requests. I made her aware that we had received the requests at 7:43 am today, and she said that her husband had went into the pharmacy on Friday evening and requested them and that the pharmacy had told him that they called [place] while he was there. I told her that I was sorry but I did not receive a phone call from the pharmacy regarding her husband. She then raised her voice and stated that she was not going to argue with me.  I told her that we have a 16-38 hour refill policy, and she said don't get smart with me I am the customer, I replied,  I understand that, but and I am only explaining the policy. She proceeded to hang up on me. I then looked and the meds have all been refilled by another employee in the office.

## 2013-06-17 ENCOUNTER — Encounter: Payer: Self-pay | Admitting: Nurse Practitioner

## 2013-06-18 ENCOUNTER — Telehealth: Payer: Self-pay | Admitting: Cardiovascular Disease

## 2013-06-18 NOTE — Telephone Encounter (Signed)
Results and plan of care reviewed with patient who verbalized understanding

## 2013-06-18 NOTE — Telephone Encounter (Signed)
New message ° ° ° ° ° °Returned a nurses call to get lab results °

## 2013-07-15 ENCOUNTER — Other Ambulatory Visit: Payer: Self-pay | Admitting: Cardiovascular Disease

## 2013-08-30 ENCOUNTER — Other Ambulatory Visit: Payer: Self-pay | Admitting: Cardiovascular Disease

## 2014-01-14 ENCOUNTER — Other Ambulatory Visit: Payer: Self-pay | Admitting: Cardiovascular Disease

## 2014-01-29 ENCOUNTER — Encounter (HOSPITAL_COMMUNITY): Payer: Self-pay | Admitting: Emergency Medicine

## 2014-01-29 DIAGNOSIS — K219 Gastro-esophageal reflux disease without esophagitis: Secondary | ICD-10-CM | POA: Insufficient documentation

## 2014-01-29 DIAGNOSIS — Z8659 Personal history of other mental and behavioral disorders: Secondary | ICD-10-CM | POA: Insufficient documentation

## 2014-01-29 DIAGNOSIS — I2510 Atherosclerotic heart disease of native coronary artery without angina pectoris: Secondary | ICD-10-CM | POA: Insufficient documentation

## 2014-01-29 DIAGNOSIS — H9209 Otalgia, unspecified ear: Secondary | ICD-10-CM | POA: Diagnosis present

## 2014-01-29 DIAGNOSIS — I10 Essential (primary) hypertension: Secondary | ICD-10-CM | POA: Diagnosis not present

## 2014-01-29 DIAGNOSIS — I252 Old myocardial infarction: Secondary | ICD-10-CM | POA: Diagnosis not present

## 2014-01-29 DIAGNOSIS — Z79899 Other long term (current) drug therapy: Secondary | ICD-10-CM | POA: Insufficient documentation

## 2014-01-29 DIAGNOSIS — F17200 Nicotine dependence, unspecified, uncomplicated: Secondary | ICD-10-CM | POA: Diagnosis not present

## 2014-01-29 DIAGNOSIS — Z7982 Long term (current) use of aspirin: Secondary | ICD-10-CM | POA: Insufficient documentation

## 2014-01-29 DIAGNOSIS — E785 Hyperlipidemia, unspecified: Secondary | ICD-10-CM | POA: Diagnosis not present

## 2014-01-29 DIAGNOSIS — Z7901 Long term (current) use of anticoagulants: Secondary | ICD-10-CM | POA: Diagnosis not present

## 2014-01-29 NOTE — ED Notes (Signed)
Pt. reports bilateral ear pressure/ ear wax impaction for 1 1/2 weeks unrelieved by ear drops and irrigation .

## 2014-01-30 ENCOUNTER — Emergency Department (HOSPITAL_COMMUNITY)
Admission: EM | Admit: 2014-01-30 | Discharge: 2014-01-30 | Disposition: A | Discharge status: Home / Self Care | Payer: 59 | Attending: Emergency Medicine | Admitting: Emergency Medicine

## 2014-01-30 DIAGNOSIS — H9202 Otalgia, left ear: Secondary | ICD-10-CM

## 2014-01-30 MED ORDER — CIPROFLOXACIN-DEXAMETHASONE 0.3-0.1 % OT SUSP
4.0000 [drp] | Freq: Two times a day (BID) | OTIC | Status: AC
Start: 2014-01-30 — End: 2014-02-03

## 2014-01-30 MED ORDER — HYDROCODONE-ACETAMINOPHEN 5-325 MG PO TABS: 1.0000 | ORAL_TABLET | ORAL | Status: DC | PRN

## 2014-01-30 NOTE — ED Notes (Signed)
Reviewed discharge instructions.  Voiced understanding.

## 2014-01-30 NOTE — ED Provider Notes (Signed)
CSN: [id number]     Arrival date & time 01/29/14  2337 History   First MD Initiated Contact with Patient 01/30/14 0145     Chief Complaint  Patient presents with  . Otalgia     (Consider location/radiation/quality/duration/timing/severity/associated sxs/prior Treatment) HPI Comments: 63 year old male with history of high blood pressure, CAD presents with left ear pain worsening since Tuesday. Patient had manual cerumen removal via nurse practitioner and as he used various solutions however patient has had intermittent severe ache that comes and goes without specific reason. No diabetes history, no drainage, no fevers or chills, no current antibiotics.  Patient is a 63 y.o. male presenting with ear pain. The history is provided by the patient.  Otalgia Associated symptoms: congestion   Associated symptoms: no fever     Past Medical History  Diagnosis Date  . Myocardial infarct      MYOCARDIAL INFARCTION, INFERIOR WALL, INITIAL EPISODE (ICD-410.41)RX OVERLAPPING  DRUG-ELUDTING STENTS RCA WITH RESIDUAL DISEASE LAD & CFX. EF INITIALLY 40% NOW 60-65%  . Hyperlipidemia   . HTN (hypertension)   . CAD (coronary artery disease)   . Anxiety   . GERD (gastroesophageal reflux disease)    History reviewed. No pertinent past surgical history. Family History  Problem Relation Age of Onset  . Other      negative FH of diabetes, hypertension or coronary artery disease   History  Substance Use Topics  . Smoking status: Current Every Day Smoker  . Smokeless tobacco: Not on file  . Alcohol Use: No    Review of Systems  Constitutional: Negative for fever and chills.  HENT: Positive for congestion and ear pain.   Respiratory: Negative for shortness of breath.       Allergies  Carvedilol and Hydrochlorothiazide  Home Medications   Prior to Admission medications   Medication Sig Start Date End Date Taking? Authorizing Provider  aspirin 81 MG tablet Take 81 mg by mouth daily.     Yes  Historical Provider, MD  atorvastatin (LIPITOR) 40 MG tablet Take 40 mg by mouth every evening.   Yes Historical Provider, MD  carvedilol (COREG) 12.5 MG tablet Take 12.5 mg by mouth 2 (two) times daily with a meal.   Yes Historical Provider, MD  clopidogrel (PLAVIX) 75 MG tablet Take 75 mg by mouth every morning.   Yes Historical Provider, MD  lisinopril (PRINIVIL,ZESTRIL) 40 MG tablet Take 40 mg by mouth every morning.   Yes Historical Provider, MD  nitroGLYCERIN (NITROSTAT) 0.4 MG SL tablet Place 0.4 mg under the tongue every 5 (five) minutes as needed for chest pain.    Yes Historical Provider, MD  pantoprazole (PROTONIX) 40 MG tablet Take 40 mg by mouth every evening.   Yes Historical Provider, MD  ciprofloxacin-dexamethasone (CIPRODEX) otic suspension Place 4 drops into the left ear 2 (two) times daily. 01/30/14 02/03/14  Mariea Clonts, MD  HYDROcodone-acetaminophen (NORCO) 5-325 MG per tablet Take 1-2 tablets by mouth every 4 (four) hours as needed. 01/30/14   Mariea Clonts, MD   BP 163/92  Pulse 87  Temp(Src) 98.3 F (36.8 C) (Oral)  Resp 14  SpO2 98% Physical Exam  Nursing note and vitals reviewed. Constitutional: He is oriented to person, place, and time. He appears well-developed and well-nourished.  HENT:  Head: Normocephalic and atraumatic.  Patient has moderate cerumen left ear canal, mild pain with range of motion of left ear, mild irritation of canal without drainage. Difficulty appreciating tympanic membrane.  Eyes: Right eye  exhibits no discharge. Left eye exhibits no discharge.  Neck: Normal range of motion. Neck supple.  Cardiovascular: Normal rate.   Pulmonary/Chest: Effort normal.  Musculoskeletal: He exhibits no edema.  Neurological: He is alert and oriented to person, place, and time.  Skin: Skin is warm. No rash noted.  Psychiatric: He has a normal mood and affect.    ED Course  Procedures (including critical care time) Labs Review Labs Reviewed - No data to  display  Imaging Review No results found.   EKG Interpretation None      MDM   Final diagnoses:  None  Left ear pain  Discussed differential for cause of pain possibly cerumen versus external otitis versus other. Patient has followup with ENT this week. Few pain meds for severe pain, pain at this point is controlled without any treatment per patient. Antibiotic drops and followup discussed  Results and differential diagnosis were discussed with the patient/parent/guardian. Close follow up outpatient was discussed, comfortable with the plan.   Medications - No data to display  Filed Vitals:   01/29/14 2349  BP: 163/92  Pulse: 87  Temp: 98.3 F (36.8 C)  TempSrc: Oral  Resp: 14  SpO2: 98%         Mariea Clonts, MD 01/30/14 0222

## 2014-01-30 NOTE — ED Notes (Signed)
Patient presents stating he has been having left ear pain for about 1 week.  States he has hard ear wax in both ears but the left is worse.  Has tried OTC drops but they have not worked.

## 2014-01-30 NOTE — Discharge Instructions (Signed)
If you were given medicines take as directed.  If you are on coumadin or contraceptives realize their levels and effectiveness is altered by many different medicines.  If you have any reaction (rash, tongues swelling, other) to the medicines stop taking and see a physician.   Please follow up as directed and return to the ER or see a physician for new or worsening symptoms.  Thank you. Filed Vitals:   01/29/14 2349  BP: 163/92  Pulse: 87  Temp: 98.3 F (36.8 C)  TempSrc: Oral  Resp: 14  SpO2: 98%   Follow up with ENT as previously arranged

## 2014-03-09 ENCOUNTER — Emergency Department (HOSPITAL_COMMUNITY): Payer: 59

## 2014-03-09 ENCOUNTER — Emergency Department (HOSPITAL_COMMUNITY)
Admission: EM | Admit: 2014-03-09 | Discharge: 2014-03-09 | Disposition: A | Discharge status: Home / Self Care | Payer: 59 | Attending: Emergency Medicine | Admitting: Emergency Medicine

## 2014-03-09 DIAGNOSIS — R079 Chest pain, unspecified: Secondary | ICD-10-CM | POA: Diagnosis present

## 2014-03-09 DIAGNOSIS — I2510 Atherosclerotic heart disease of native coronary artery without angina pectoris: Secondary | ICD-10-CM | POA: Insufficient documentation

## 2014-03-09 DIAGNOSIS — F419 Anxiety disorder, unspecified: Secondary | ICD-10-CM | POA: Insufficient documentation

## 2014-03-09 DIAGNOSIS — I10 Essential (primary) hypertension: Secondary | ICD-10-CM | POA: Insufficient documentation

## 2014-03-09 DIAGNOSIS — I252 Old myocardial infarction: Secondary | ICD-10-CM | POA: Insufficient documentation

## 2014-03-09 DIAGNOSIS — Z7982 Long term (current) use of aspirin: Secondary | ICD-10-CM | POA: Insufficient documentation

## 2014-03-09 DIAGNOSIS — Z720 Tobacco use: Secondary | ICD-10-CM | POA: Diagnosis not present

## 2014-03-09 DIAGNOSIS — K219 Gastro-esophageal reflux disease without esophagitis: Secondary | ICD-10-CM | POA: Diagnosis not present

## 2014-03-09 DIAGNOSIS — M79622 Pain in left upper arm: Secondary | ICD-10-CM | POA: Diagnosis not present

## 2014-03-09 DIAGNOSIS — E785 Hyperlipidemia, unspecified: Secondary | ICD-10-CM | POA: Diagnosis not present

## 2014-03-09 DIAGNOSIS — M546 Pain in thoracic spine: Secondary | ICD-10-CM | POA: Diagnosis not present

## 2014-03-09 DIAGNOSIS — Z7901 Long term (current) use of anticoagulants: Secondary | ICD-10-CM | POA: Insufficient documentation

## 2014-03-09 DIAGNOSIS — Z79899 Other long term (current) drug therapy: Secondary | ICD-10-CM | POA: Diagnosis not present

## 2014-03-09 DIAGNOSIS — M549 Dorsalgia, unspecified: Secondary | ICD-10-CM

## 2014-03-09 LAB — CBC
HCT: 46.1 % (ref 39.0–52.0)
Hemoglobin: 15.9 g/dL (ref 13.0–17.0)
MCH: 29.9 pg (ref 26.0–34.0)
MCHC: 34.5 g/dL (ref 30.0–36.0)
MCV: 86.7 fL (ref 78.0–100.0)
PLATELETS: 274 10*3/uL (ref 150–400)
RBC: 5.32 MIL/uL (ref 4.22–5.81)
RDW: 12.5 % (ref 11.5–15.5)
WBC: 7.5 10*3/uL (ref 4.0–10.5)

## 2014-03-09 LAB — I-STAT TROPONIN, ED: Troponin i, poc: 0 ng/mL (ref 0.00–0.08)

## 2014-03-09 LAB — BASIC METABOLIC PANEL
Anion gap: 14 (ref 5–15)
BUN: 9 mg/dL (ref 6–23)
CO2: 24 meq/L (ref 19–32)
Calcium: 9.1 mg/dL (ref 8.4–10.5)
Chloride: 102 mEq/L (ref 96–112)
Creatinine, Ser: 0.86 mg/dL (ref 0.50–1.35)
GFR calc non Af Amer: >90 mL/min (ref >90)
GLUCOSE: 137 mg/dL — AB (ref 70–99)
POTASSIUM: 4.6 meq/L (ref 3.7–5.3)
Sodium: 140 mEq/L (ref 137–147)

## 2014-03-09 LAB — TROPONIN I

## 2014-03-09 MED ORDER — ASPIRIN 81 MG PO CHEW
243.0000 mg | CHEWABLE_TABLET | Freq: Once | ORAL | Status: AC
  Administered 2014-03-09: 243 mg via ORAL
  Filled 2014-03-09: qty 3

## 2014-03-09 NOTE — ED Notes (Signed)
Pt states the pain that was concerning started last night around 01:00am when the pain started in his left shoulder and radiated across his back.  Patient states the pain lasted for approximately 2 minutes, once he got up and out of bed the pain resolved on its own.  Pt is attributing a lot of his pain to the way he sleeps with his arm raised and above his head in a bent position.  Pt is denying chest pain.

## 2014-03-09 NOTE — Discharge Instructions (Signed)
Read the information below.  You may return to the Emergency Department at any time for worsening condition or any new symptoms that concern you.  If you develop worsening back pain, any chest pain, shortness of breath, fever, you pass out, or become weak or dizzy, return to the ER for a recheck.

## 2014-03-09 NOTE — ED Provider Notes (Signed)
CSN: [id number]     Arrival date & time 03/09/14  7482 History   First MD Initiated Contact with Patient 03/09/14 [phone]     Chief Complaint  Patient presents with  . Chest Pain     (Consider location/radiation/quality/duration/timing/severity/associated sxs/prior Treatment) HPI  Patient with hx CAD, s/p MI with stents placed, p/w left sided axillary and upper back pain that woke him from sleep at 2:00am, with no associated symptoms.  States he woke up lying on his left side with his head propped up on his hand.  States moving out of that position made the pain go away.  The pain lasted only a few minutes. Denies lightheadedness/dizziness, SOB, nausea, leg swelling.  Pt has been pain free since.  Patient's prior MI presenting symptom was pain between his shoulder blades - states this is totally different from his MI pain, which was dull and between his shoulder blades, intermittent.  Past Medical History  Diagnosis Date  . Myocardial infarct      MYOCARDIAL INFARCTION, INFERIOR WALL, INITIAL EPISODE (ICD-410.41)RX OVERLAPPING  DRUG-ELUDTING STENTS RCA WITH RESIDUAL DISEASE LAD & CFX. EF INITIALLY 40% NOW 60-65%  . Hyperlipidemia   . HTN (hypertension)   . CAD (coronary artery disease)   . Anxiety   . GERD (gastroesophageal reflux disease)    No past surgical history on file. Family History  Problem Relation Age of Onset  . Other      negative FH of diabetes, hypertension or coronary artery disease   History  Substance Use Topics  . Smoking status: Current Every Day Smoker  . Smokeless tobacco: Not on file  . Alcohol Use: No    Review of Systems  All other systems reviewed and are negative.     Allergies  Carvedilol and Hydrochlorothiazide  Home Medications   Prior to Admission medications   Medication Sig Start Date End Date Taking? Authorizing Provider  aspirin EC 81 MG tablet Take 81 mg by mouth daily.   Yes Historical Provider, MD  atorvastatin (LIPITOR) 40 MG  tablet Take 40 mg by mouth every evening.   Yes Historical Provider, MD  carvedilol (COREG) 12.5 MG tablet Take 12.5 mg by mouth 2 (two) times daily with a meal.   Yes Historical Provider, MD  clopidogrel (PLAVIX) 75 MG tablet Take 75 mg by mouth every morning.   Yes Historical Provider, MD  lisinopril (PRINIVIL,ZESTRIL) 40 MG tablet Take 40 mg by mouth every morning.   Yes Historical Provider, MD  pantoprazole (PROTONIX) 40 MG tablet Take 40 mg by mouth every evening.   Yes Historical Provider, MD  nitroGLYCERIN (NITROSTAT) 0.4 MG SL tablet Place 0.4 mg under the tongue every 5 (five) minutes as needed for chest pain.     Historical Provider, MD   BP 163/107  Pulse 83  Temp(Src) 98.1 F (36.7 C)  Resp 18  SpO2 98% Physical Exam  Nursing note and vitals reviewed. Constitutional: He appears well-developed and well-nourished. No distress.  HENT:  Head: Normocephalic and atraumatic.  Eyes: Conjunctivae are normal.  Neck: Neck supple.  Cardiovascular: Normal rate and regular rhythm.   Pulmonary/Chest: Effort normal and breath sounds normal. No respiratory distress. He has no wheezes. He has no rales. He exhibits no tenderness.  Abdominal: Soft. He exhibits no distension and no mass. There is no tenderness. There is no rebound and no guarding.  Musculoskeletal: He exhibits no edema and no tenderness.  Back without tenderness. Chest nontender.  Neurological: He is alert. He exhibits  normal muscle tone.  Skin: He is not diaphoretic.  Psychiatric: He has a normal mood and affect. His behavior is normal.    ED Course  Procedures (including critical care time) Labs Review Labs Reviewed  BASIC METABOLIC PANEL - Abnormal; Notable for the following:    Glucose, Bld 137 (*)    All other components within normal limits  CBC  TROPONIN I  I-STAT TROPOININ, ED    Imaging Review Dg Chest 2 View  03/09/2014   CLINICAL DATA:  Chest pain radiating to the back since 03/08/2014. Smoker.  EXAM:  CHEST  2 VIEW  COMPARISON:  Single view of the chest 11/13/2006.  FINDINGS: Lung volumes are low but the lungs are clear. Heart size is normal. Peribronchial thickening is unchanged. There is no pneumothorax or pleural effusion. No focal bony abnormality.  IMPRESSION: No acute disease.  Chronic bronchitic change.   Electronically Signed   By: Inge Rise M.D.   On: 03/09/2014 10:17     EKG Interpretation   Date/Time:  Wednesday March 09 2014 08:43:47 EDT Ventricular Rate:  82 PR Interval:  148 QRS Duration: 86 QT Interval:  366 QTC Calculation: 427 R Axis:   18 Text Interpretation:  Normal sinus rhythm Cannot rule out Anterior infarct  , age undetermined Similar to prior Confirmed by Mingo Amber  MD, [place] [phone])  on 03/09/2014 8:54:43 AM      10:37 AM Discussed pt with Dr Mingo Amber who agrees with workup and plan for discharge home.   Filed Vitals:   03/09/14 1045  BP: 142/72  Pulse: 72  Temp:   Resp: 19     MDM   Final diagnoses:  Upper back pain on left side    Afebrile, nontoxic patient with left axillary and back pain associated with sleeping in odd position, resolved with change of position.  No associated symptoms.  Pt does have CAD and hx MI but given the situation I doubt ACS in this patient.  Troponin negative, 7.5 hours after the event.  EKG unchanged from prior.    D/C home with PCP/cardiology follow up.  Discussed result, findings, treatment, and follow up  with patient.  Pt given return precautions.  Pt verbalizes understanding and agrees with plan.         Clayton Bibles, PA-C 03/09/14 1538

## 2014-03-09 NOTE — ED Notes (Signed)
Per pt sts episode of chest pain last night after sleeping in a weird position. sts once he woke up and moved around the pain went away. Denies any pain now or associated symptoms.

## 2014-03-10 NOTE — ED Provider Notes (Signed)
Medical screening examination/treatment/procedure(s) were performed by non-physician practitioner and as supervising physician I was immediately available for consultation/collaboration.   EKG Interpretation   Date/Time:  Wednesday March 09 2014 08:43:47 EDT Ventricular Rate:  82 PR Interval:  148 QRS Duration: 86 QT Interval:  366 QTC Calculation: 427 R Axis:   18 Text Interpretation:  Normal sinus rhythm Cannot rule out Anterior infarct  , age undetermined Similar to prior Confirmed by Mingo Amber  MD, [place] ([id number])  on 03/09/2014 8:54:43 AM        Evelina Bucy, MD 03/10/14 [phone]

## 2014-03-24 ENCOUNTER — Ambulatory Visit (INDEPENDENT_AMBULATORY_CARE_PROVIDER_SITE_OTHER): Payer: 59 | Admitting: Cardiovascular Disease

## 2014-03-24 ENCOUNTER — Encounter: Payer: Self-pay | Admitting: Cardiovascular Disease

## 2014-03-24 VITALS — BP 170/110 | HR 74 | Ht 65.0 in | Wt 181.1 lb

## 2014-03-24 DIAGNOSIS — I2510 Atherosclerotic heart disease of native coronary artery without angina pectoris: Secondary | ICD-10-CM

## 2014-03-24 DIAGNOSIS — E785 Hyperlipidemia, unspecified: Secondary | ICD-10-CM

## 2014-03-24 MED ORDER — ATORVASTATIN CALCIUM 20 MG PO TABS: 20.0000 mg | ORAL_TABLET | Freq: Every evening | ORAL | Status: DC

## 2014-03-24 MED ORDER — NITROGLYCERIN 0.4 MG SL SUBL: 0.4000 mg | SUBLINGUAL_TABLET | SUBLINGUAL | Status: AC | PRN

## 2014-03-24 NOTE — Progress Notes (Signed)
Background: The patient is followed for coronary artery disease, hypertension, and hyperlipidemia. He initially presented in 2008 with an inferior wall MI. He was treated with overlapping Taxus drug-eluting stents in the right coronary artery.   He has tolerated long-term dual antiplatelet therapy with aspirin and Plavix. He has long-standing whitecoat hypertension.  HPI:  63 year old gentleman presenting for follow-up evaluation. He was recently seen in the emergency room with atypical chest and upper back pain. It was felt to be musculoskeletal. Troponin was normal. There were no EKG changes. He was advised to follow-up closely and presents today for an office visit.  He is doing fine. Remains physically active without exertional symptoms. Specifically denies chest pain or pressure, dyspnea, or edema. No symptoms like those of his MI.  Studies:  Lipid Panel 06/08/2013    Component Value Date/Time   CHOL 116 06/08/2013 0840   TRIG 247.0* 06/08/2013 0840   HDL 32.20* 06/08/2013 0840   CHOLHDL 4 06/08/2013 0840   VLDL 49.4* 06/08/2013 0840   LDLCALC 53 04/28/2012 0936   LDLDIRECT 48.6 06/08/2013 0840     Outpatient Encounter Prescriptions as of 03/24/2014  Medication Sig  . aspirin EC 81 MG tablet Take 81 mg by mouth daily.  Marland Kitchen atorvastatin (LIPITOR) 40 MG tablet Take 40 mg by mouth every evening.  . carvedilol (COREG) 12.5 MG tablet Take 12.5 mg by mouth 2 (two) times daily with a meal.  . clopidogrel (PLAVIX) 75 MG tablet Take 75 mg by mouth every morning.  Marland Kitchen lisinopril (PRINIVIL,ZESTRIL) 40 MG tablet Take 40 mg by mouth every morning.  . nitroGLYCERIN (NITROSTAT) 0.4 MG SL tablet Place 0.4 mg under the tongue every 5 (five) minutes as needed for chest pain.   . pantoprazole (PROTONIX) 40 MG tablet Take 40 mg by mouth every evening.    Allergies  Allergen Reactions  . Carvedilol Other (See Comments)    REACTION: can only take low doses  . Hydrochlorothiazide Other (See Comments)   Leg numbness.    Past Medical History  Diagnosis Date  . Myocardial infarct      MYOCARDIAL INFARCTION, INFERIOR WALL, INITIAL EPISODE (ICD-410.41)RX OVERLAPPING  DRUG-ELUDTING STENTS RCA WITH RESIDUAL DISEASE LAD & CFX. EF INITIALLY 40% NOW 60-65%  . Hyperlipidemia   . HTN (hypertension)   . CAD (coronary artery disease)   . Anxiety   . GERD (gastroesophageal reflux disease)     family history includes Other in an other family member.   ROS: Negative except as per HPI  BP 170/110  Pulse 74  Ht 5\' 5"  (1.651 m)  Wt 181 lb 1.9 oz (82.155 kg)  BMI 30.14 kg/m2  SpO2 96%  PHYSICAL EXAM: Pt is alert and oriented, NAD HEENT: normal Neck: JVP - normal, carotids 2+= without bruits Lungs: CTA bilaterally CV: RRR without murmur or gallop Abd: soft, NT, Positive BS, no hepatomegaly Ext: no C/C/E, distal pulses intact and equal Skin: warm/dry no rash  EKG:  03/09/2014: Normal sinus rhythm, cannot rule out age-indeterminate anterior MI, otherwise within normal limits with no significant ST/T-wave changes.  ASSESSMENT AND PLAN: 1. Coronary artery disease, native vessel. The patient's symptoms that prompted emergency department evaluation were highly atypical. He has no symptoms of angina. He will continue on his home therapies. I have recommended long-term dual antiplatelet therapy in the setting of first generation DES and ongoing significant risk factors.  2. Essential hypertension, with marked component of whitecoat syndrome. He reports home blood pressures in the 130s over 80s.  He has been unable to tolerate any increases in his antihypertensive medications. Will continue lisinopril and carvedilol at current doses.  3. Hyperlipidemia. The patient complains of mild myalgias. He wants to reduce his atorvastatin. Will decrease to 20 mg daily and repeat lipids in 3 months.  4. Tobacco abuse. Not interested in quitting. Cessation counseling done.  For follow-up I will see him back in  one year.  [name], MD 03/24/2014 11:32 AM

## 2014-03-24 NOTE — Patient Instructions (Signed)
Your physician has recommended you make the following change in your medication:  DECREASE Atorvastatin to 20mg  take one by mouth every evening  Your physician recommends that you return for a FASTING LIPID and LIVER Profile in January 2016--nothing to eat or drink after midnight, lab opens at 7:30 AM  Your physician wants you to follow-up in: 1 YEAR with Dr Burt Knack.  You will receive a reminder letter in the mail two months in advance. If you don't receive a letter, please call our office to schedule the follow-up appointment.

## 2014-04-07 ENCOUNTER — Encounter: Payer: Self-pay | Admitting: Cardiovascular Disease

## 2014-04-07 NOTE — Telephone Encounter (Signed)
Will forward to Dr. Burt Knack to review.

## 2014-04-07 NOTE — Telephone Encounter (Signed)
New message     Do Dr Burt Knack know a cardiologist in the [place] area?  Friends are moving to [place] and they do not know who to tell them to go to

## 2014-04-08 NOTE — Telephone Encounter (Signed)
Don't know anyone in [place]. sorry

## 2014-04-08 NOTE — Telephone Encounter (Signed)
This encounter was created in error - please disregard.

## 2014-04-13 ENCOUNTER — Other Ambulatory Visit: Payer: Self-pay | Admitting: Cardiovascular Disease

## 2014-05-09 ENCOUNTER — Telehealth: Payer: Self-pay | Admitting: Cardiovascular Disease

## 2014-05-09 NOTE — Telephone Encounter (Signed)
Received request from Nurse fax box, documents faxed for surgical clearance. To: Ethel Rana Fax number: [phone] Attention:  12.14.15/km

## 2014-06-07 ENCOUNTER — Other Ambulatory Visit (INDEPENDENT_AMBULATORY_CARE_PROVIDER_SITE_OTHER): Payer: 59 | Admitting: *Deleted

## 2014-06-07 DIAGNOSIS — E785 Hyperlipidemia, unspecified: Secondary | ICD-10-CM

## 2014-06-07 LAB — LIPID PANEL
Cholesterol: 144 mg/dL (ref 0–200)
HDL: 31.1 mg/dL — ABNORMAL LOW (ref >39.00)
NonHDL: 112.9
Total CHOL/HDL Ratio: 5
Triglycerides: 276 mg/dL — ABNORMAL HIGH (ref 0.0–149.0)
VLDL: 55.2 mg/dL — ABNORMAL HIGH (ref 0.0–40.0)

## 2014-06-07 LAB — HEPATIC FUNCTION PANEL
ALBUMIN: 4.2 g/dL (ref 3.5–5.2)
ALT: 16 U/L (ref 0–53)
AST: 11 U/L (ref 0–37)
Alkaline Phosphatase: 75 U/L (ref 39–117)
BILIRUBIN DIRECT: 0 mg/dL (ref 0.0–0.3)
Total Bilirubin: 0.6 mg/dL (ref 0.2–1.2)
Total Protein: 7.4 g/dL (ref 6.0–8.3)

## 2014-06-07 LAB — LDL CHOLESTEROL, DIRECT: Direct LDL: 64.7 mg/dL

## 2014-08-10 ENCOUNTER — Telehealth: Payer: Self-pay | Admitting: Cardiovascular Disease

## 2014-08-10 NOTE — Telephone Encounter (Signed)
New message     Pt c/o medication issue:  1. Name of Medication: carvedilol 12.5 mg   2. How are you currently taking this medication (dosage and times per day)? Taken for 8 years.  Twice a day. 125. Mg in am / 12.5 mg pm  3. Are you having a reaction (difficulty breathing--STAT)? Finger / hand numb, tingling , dizziness. Mental confusion.    4. What is your medication issue? Would like to discuss options. Alternatives.

## 2014-08-10 NOTE — Telephone Encounter (Signed)
I spoke with the pt and he read the side effects of Carvedilol and states that he notices some finger/hand numbness when turning a wrench and driving a car.  The pt will also notice occasional dizziness when going from a sitting to standing position.  The pt's symptoms are not effecting his daily activities. I made the pt aware of indication for Carvedilol and at this time he plans to continue to take this medication. I made the pt aware that finger/hand numbness could be related to carpal tunnel syndrome. The pt will call the office with any other questions or concerns.

## 2014-09-09 ENCOUNTER — Other Ambulatory Visit: Payer: Self-pay | Admitting: Cardiovascular Disease

## 2014-09-28 ENCOUNTER — Other Ambulatory Visit (HOSPITAL_COMMUNITY): Payer: Self-pay

## 2014-09-28 ENCOUNTER — Emergency Department (HOSPITAL_COMMUNITY): Payer: 59

## 2014-09-28 ENCOUNTER — Inpatient Hospital Stay (HOSPITAL_COMMUNITY)
Admission: EM | Admit: 2014-09-28 | Discharge: 2014-10-02 | DRG: 872 | Disposition: A | Discharge status: Home / Self Care | Payer: 59 | Attending: Internal Medicine | Admitting: Internal Medicine

## 2014-09-28 ENCOUNTER — Encounter (HOSPITAL_COMMUNITY): Payer: Self-pay | Admitting: *Deleted

## 2014-09-28 ENCOUNTER — Inpatient Hospital Stay (HOSPITAL_COMMUNITY): Payer: 59

## 2014-09-28 ENCOUNTER — Encounter (HOSPITAL_COMMUNITY): Payer: Self-pay

## 2014-09-28 ENCOUNTER — Emergency Department (HOSPITAL_COMMUNITY)
Admission: EM | Admit: 2014-09-28 | Discharge: 2014-09-28 | Disposition: A | Discharge status: Other Acute Inpatient Hospital | Payer: 59 | Source: Home / Self Care

## 2014-09-28 DIAGNOSIS — R350 Frequency of micturition: Secondary | ICD-10-CM | POA: Diagnosis not present

## 2014-09-28 DIAGNOSIS — N5082 Scrotal pain: Secondary | ICD-10-CM

## 2014-09-28 DIAGNOSIS — A419 Sepsis, unspecified organism: Principal | ICD-10-CM | POA: Diagnosis present

## 2014-09-28 DIAGNOSIS — R3989 Other symptoms and signs involving the genitourinary system: Secondary | ICD-10-CM | POA: Diagnosis not present

## 2014-09-28 DIAGNOSIS — R509 Fever, unspecified: Secondary | ICD-10-CM

## 2014-09-28 DIAGNOSIS — R6510 Systemic inflammatory response syndrome (SIRS) of non-infectious origin without acute organ dysfunction: Secondary | ICD-10-CM | POA: Insufficient documentation

## 2014-09-28 DIAGNOSIS — Z955 Presence of coronary angioplasty implant and graft: Secondary | ICD-10-CM

## 2014-09-28 DIAGNOSIS — I10 Essential (primary) hypertension: Secondary | ICD-10-CM | POA: Diagnosis present

## 2014-09-28 DIAGNOSIS — Z888 Allergy status to other drugs, medicaments and biological substances status: Secondary | ICD-10-CM | POA: Diagnosis not present

## 2014-09-28 DIAGNOSIS — E876 Hypokalemia: Secondary | ICD-10-CM | POA: Diagnosis present

## 2014-09-28 DIAGNOSIS — Z7982 Long term (current) use of aspirin: Secondary | ICD-10-CM | POA: Diagnosis not present

## 2014-09-28 DIAGNOSIS — R300 Dysuria: Secondary | ICD-10-CM | POA: Diagnosis not present

## 2014-09-28 DIAGNOSIS — F419 Anxiety disorder, unspecified: Secondary | ICD-10-CM | POA: Diagnosis present

## 2014-09-28 DIAGNOSIS — F17210 Nicotine dependence, cigarettes, uncomplicated: Secondary | ICD-10-CM | POA: Diagnosis present

## 2014-09-28 DIAGNOSIS — I2510 Atherosclerotic heart disease of native coronary artery without angina pectoris: Secondary | ICD-10-CM | POA: Diagnosis present

## 2014-09-28 DIAGNOSIS — E785 Hyperlipidemia, unspecified: Secondary | ICD-10-CM | POA: Diagnosis present

## 2014-09-28 DIAGNOSIS — K219 Gastro-esophageal reflux disease without esophagitis: Secondary | ICD-10-CM | POA: Diagnosis present

## 2014-09-28 DIAGNOSIS — N390 Urinary tract infection, site not specified: Secondary | ICD-10-CM | POA: Diagnosis present

## 2014-09-28 DIAGNOSIS — I252 Old myocardial infarction: Secondary | ICD-10-CM | POA: Diagnosis not present

## 2014-09-28 DIAGNOSIS — R39198 Other difficulties with micturition: Secondary | ICD-10-CM | POA: Insufficient documentation

## 2014-09-28 LAB — COMPREHENSIVE METABOLIC PANEL
ALK PHOS: 77 U/L (ref 38–126)
ALT: 22 U/L (ref 17–63)
ANION GAP: 11 (ref 5–15)
AST: 19 U/L (ref 15–41)
Albumin: 4.1 g/dL (ref 3.5–5.0)
BILIRUBIN TOTAL: 0.6 mg/dL (ref 0.3–1.2)
BUN: 10 mg/dL (ref 6–20)
CHLORIDE: 99 mmol/L — AB (ref 101–111)
CO2: 23 mmol/L (ref 22–32)
Calcium: 8.8 mg/dL — ABNORMAL LOW (ref 8.9–10.3)
Creatinine, Ser: 0.96 mg/dL (ref 0.61–1.24)
GFR calc non Af Amer: >60 mL/min (ref >60)
GLUCOSE: 117 mg/dL — AB (ref 70–99)
Potassium: 3.7 mmol/L (ref 3.5–5.1)
Sodium: 133 mmol/L — ABNORMAL LOW (ref 135–145)
TOTAL PROTEIN: 7.2 g/dL (ref 6.5–8.1)

## 2014-09-28 LAB — URINALYSIS, ROUTINE W REFLEX MICROSCOPIC
Bilirubin Urine: NEGATIVE
Glucose, UA: NEGATIVE mg/dL
KETONES UR: NEGATIVE mg/dL
Nitrite: NEGATIVE
PROTEIN: 100 mg/dL — AB
SPECIFIC GRAVITY, URINE: 1.012 (ref 1.005–1.030)
UROBILINOGEN UA: 0.2 mg/dL (ref 0.0–1.0)
pH: 5 (ref 5.0–8.0)

## 2014-09-28 LAB — CBC WITH DIFFERENTIAL/PLATELET
Basophils Absolute: 0 10*3/uL (ref 0.0–0.1)
Basophils Relative: 0 % (ref 0–1)
Eosinophils Absolute: 0.1 10*3/uL (ref 0.0–0.7)
Eosinophils Relative: 1 % (ref 0–5)
HCT: 46.6 % (ref 39.0–52.0)
HEMOGLOBIN: 15.7 g/dL (ref 13.0–17.0)
LYMPHS ABS: 0.5 10*3/uL — AB (ref 0.7–4.0)
Lymphocytes Relative: 6 % — ABNORMAL LOW (ref 12–46)
MCH: 29.5 pg (ref 26.0–34.0)
MCHC: 33.7 g/dL (ref 30.0–36.0)
MCV: 87.6 fL (ref 78.0–100.0)
MONOS PCT: 0 % — AB (ref 3–12)
Monocytes Absolute: 0 10*3/uL — ABNORMAL LOW (ref 0.1–1.0)
NEUTROS ABS: 8.7 10*3/uL — AB (ref 1.7–7.7)
NEUTROS PCT: 93 % — AB (ref 43–77)
PLATELETS: 220 10*3/uL (ref 150–400)
RBC: 5.32 MIL/uL (ref 4.22–5.81)
RDW: 12.7 % (ref 11.5–15.5)
WBC: 9.4 10*3/uL (ref 4.0–10.5)

## 2014-09-28 LAB — I-STAT CG4 LACTIC ACID, ED: Lactic Acid, Venous: 1.96 mmol/L (ref 0.5–2.0)

## 2014-09-28 LAB — URINE MICROSCOPIC-ADD ON

## 2014-09-28 MED ORDER — DEXTROSE 5 % IV SOLN
2.0000 g | Freq: Once | INTRAVENOUS | Status: AC
  Administered 2014-09-28: 2 g via INTRAVENOUS
  Filled 2014-09-28: qty 2

## 2014-09-28 MED ORDER — DEXTROSE 5 % IV SOLN: 1.0000 g | Freq: Once | INTRAVENOUS | Status: DC

## 2014-09-28 MED ORDER — SODIUM CHLORIDE 0.9 % IV SOLN
Freq: Once | INTRAVENOUS | Status: AC
  Administered 2014-09-28: via INTRAVENOUS

## 2014-09-28 MED ORDER — SODIUM CHLORIDE 0.9 % IV BOLUS (SEPSIS)
1000.0000 mL | Freq: Once | INTRAVENOUS | Status: AC
  Administered 2014-09-28: 1000 mL via INTRAVENOUS

## 2014-09-28 MED ORDER — ACETAMINOPHEN 325 MG PO TABS
650.0000 mg | ORAL_TABLET | Freq: Four times a day (QID) | ORAL | Status: DC | PRN
  Administered 2014-09-28: 650 mg via ORAL
  Filled 2014-09-28: qty 2

## 2014-09-28 NOTE — ED Notes (Signed)
Before Void 28 ml, after void 21 ml

## 2014-09-28 NOTE — ED Provider Notes (Signed)
CSN: [id number]     Arrival date & time 09/28/14  2001 History   First MD Initiated Contact with Patient 09/28/14 2140     Chief Complaint  Patient presents with  . Urinary Retention  . Abdominal Pain     (Consider location/radiation/quality/duration/timing/severity/associated sxs/prior Treatment) HPI Alexander [name] is a 64 y.o. male with history of coronary disease, hypertension, hyperlipidemia, presents to emergency department complaining of dysuria, urinary frequency and urgency, fever. Patient states he has had urinary symptoms for about a month. He states he feels like he is not emptying bladder completely. States today he developed fever and generalized malaise. No nausea or vomiting. No abdominal back pain. No rectal pain. Denies history of the same. No URI symptoms but does report some allergies that he has had for several weeks. Denies shortness of breath. No medications taken prior to coming in.  Past Medical History  Diagnosis Date  . Myocardial infarct      MYOCARDIAL INFARCTION, INFERIOR WALL, INITIAL EPISODE (ICD-410.41)RX OVERLAPPING  DRUG-ELUDTING STENTS RCA WITH RESIDUAL DISEASE LAD & CFX. EF INITIALLY 40% NOW 60-65%  . Hyperlipidemia   . HTN (hypertension)   . CAD (coronary artery disease)   . Anxiety   . GERD (gastroesophageal reflux disease)    History reviewed. No pertinent past surgical history. Family History  Problem Relation Age of Onset  . Other      negative FH of diabetes, hypertension or coronary artery disease   History  Substance Use Topics  . Smoking status: Current Every Day Smoker  . Smokeless tobacco: Not on file  . Alcohol Use: No    Review of Systems  Constitutional: Positive for fever, chills and fatigue.  HENT: Positive for congestion.   Respiratory: Negative for cough, chest tightness and shortness of breath.   Cardiovascular: Negative for chest pain, palpitations and leg swelling.  Gastrointestinal: Negative for nausea, vomiting,  abdominal pain, diarrhea and abdominal distention.  Genitourinary: Positive for dysuria, urgency and frequency. Negative for hematuria.  Musculoskeletal: Negative for myalgias, arthralgias, neck pain and neck stiffness.  Skin: Negative for rash.  Allergic/Immunologic: Negative for immunocompromised state.  Neurological: Positive for weakness. Negative for dizziness, light-headedness, numbness and headaches.  All other systems reviewed and are negative.     Allergies  Carvedilol and Hydrochlorothiazide  Home Medications   Prior to Admission medications   Medication Sig Start Date End Date Taking? Authorizing Provider  aspirin EC 81 MG tablet Take 81 mg by mouth daily.    Historical Provider, MD  atorvastatin (LIPITOR) 20 MG tablet Take 1 tablet (20 mg total) by mouth every evening. 03/24/14   [name], MD  carvedilol (COREG) 12.5 MG tablet TAKE 1 TABLET BY MOUTH TWICE A DAY 09/09/14   [name], MD  clopidogrel (PLAVIX) 75 MG tablet TAKE 1 TABLET BY MOUTH EVERY DAY (INS WILL COVER ON 4/9) 09/09/14   [name], MD  lisinopril (PRINIVIL,ZESTRIL) 40 MG tablet TAKE 1 TABLET BY MOUTH EVERY DAY 09/09/14   [name], MD  nitroGLYCERIN (NITROSTAT) 0.4 MG SL tablet Place 1 tablet (0.4 mg total) under the tongue every 5 (five) minutes as needed for chest pain. 03/24/14   [name], MD  pantoprazole (PROTONIX) 40 MG tablet TAKE 1 TABLET BY MOUTH EVERY DAY 04/14/14   [name], MD   BP 123/62 mmHg  Pulse 114  Temp(Src) 103.8 F (39.9 C) (Oral)  Resp 18  SpO2 93% Physical Exam  Constitutional: He is oriented to person, place, and time.  He appears well-developed and well-nourished.  Patient is flushed  HENT:  Head: Normocephalic and atraumatic.  Eyes: Conjunctivae are normal.  Neck: Normal range of motion. Neck supple.  No meningismus  Cardiovascular: Normal rate, regular rhythm and normal heart sounds.   Pulmonary/Chest: Effort normal and breath sounds normal.  No respiratory distress. He has no wheezes. He has no rales.  Abdominal: Soft. Bowel sounds are normal. He exhibits no distension. There is no tenderness. There is no rebound and no guarding.  Musculoskeletal: He exhibits no edema.  Neurological: He is alert and oriented to person, place, and time.  Skin: Skin is warm and dry.  Nursing note and vitals reviewed.   ED Course  Procedures (including critical care time) Labs Review Labs Reviewed  URINALYSIS, ROUTINE W REFLEX MICROSCOPIC - Abnormal; Notable for the following:    APPearance CLOUDY (*)    Hgb urine dipstick LARGE (*)    Protein, ur 100 (*)    Leukocytes, UA MODERATE (*)    All other components within normal limits  URINE MICROSCOPIC-ADD ON - Abnormal; Notable for the following:    Bacteria, UA FEW (*)    All other components within normal limits  CULTURE, BLOOD (ROUTINE X 2)  CULTURE, BLOOD (ROUTINE X 2)  URINE CULTURE  CBC WITH DIFFERENTIAL/PLATELET  COMPREHENSIVE METABOLIC PANEL  I-STAT CG4 LACTIC ACID, ED    Imaging Review Dg Chest Portable 1 View  09/28/2014   CLINICAL DATA:  Fever to 103 .  Difficulty urinating.  EXAM: PORTABLE CHEST - 1 VIEW  COMPARISON:  Radiograph 03/09/2014  FINDINGS: Normal mediastinum and cardiac silhouette. Normal pulmonary vasculature. No evidence of effusion, infiltrate, or pneumothorax. No acute bony abnormality.  IMPRESSION: No acute cardiopulmonary process.   Electronically Signed   By: Suzy Bouchard M.D.   On: 09/28/2014 22:56     EKG Interpretation None      CRITICAL CARE Performed by: Jeannett Senior A Total critical care time: 30 Critical care time was exclusive of separately billable procedures and treating other patients. Critical care was necessary to treat or prevent imminent or life-threatening deterioration. Critical care was time spent personally by me on the following activities: development of treatment plan with patient and/or surrogate as well as nursing,  discussions with consultants, evaluation of patient's response to treatment, examination of patient, obtaining history from patient or surrogate, ordering and performing treatments and interventions, ordering and review of laboratory studies, ordering and review of radiographic studies, pulse oximetry and re-evaluation of patient's condition.  MDM   Final diagnoses:  UTI (lower urinary tract infection)  Fever, unspecified fever cause  SIRS (systemic inflammatory response syndrome)    9:55 PM Patient seen and examined, patient febrile temp 103.8. Tachycardic. Many SIRS criteria. Blood pressure is normal. He is mentating well. He is complaining of dysuria and urinary frequency. He has no other complaints. Specifically no abdominal or back pain. No headache, no neck pain or stiffness. Labs including blood cultures obtained, will start IV fluids. Tylenol ordered for fever.  12:16 AM The patient's postvoid residual showed 28 mL. He continues to be tachycardic. Fluids are running. Covered for possible urosepsis with 2 g of Rocephin. Cultures of urine and blood sent. Patient is mentating well but very somnolent. Will admit. I spoke with tried hospitalist, who accepted patient for admission to stepdown.  Filed Vitals:   09/28/14 2146 09/28/14 2147 09/28/14 2200 09/28/14 2215  BP:  123/62 110/71 110/52  Pulse:  114 110 108  Temp: 102.9 F (  39.4 C) 103.8 F (39.9 C)    TempSrc: Oral     Resp:  18 17 22   SpO2:  93% 94% 94%      Jeannett Senior, PA-C 09/29/14 0018  Jeannett Senior, PA-C 09/29/14 0018  Milton Ferguson, MD 09/29/14 1320

## 2014-09-28 NOTE — ED Provider Notes (Signed)
Alexander [name] is a 64 y.o. male who presents to Urgent Care today for urinary frequency urgency and dysuria associated with a sensation of not being able to fully empty his bladder. This is been ongoing now for weeks. However today he became acutely worsened. He notes abdominal pressure and pain fevers and chills. No chest pains or palpitations.   Past Medical History  Diagnosis Date  . Myocardial infarct      MYOCARDIAL INFARCTION, INFERIOR WALL, INITIAL EPISODE (ICD-410.41)RX OVERLAPPING  DRUG-ELUDTING STENTS RCA WITH RESIDUAL DISEASE LAD & CFX. EF INITIALLY 40% NOW 60-65%  . Hyperlipidemia   . HTN (hypertension)   . CAD (coronary artery disease)   . Anxiety   . GERD (gastroesophageal reflux disease)    History reviewed. No pertinent past surgical history. History  Substance Use Topics  . Smoking status: Current Every Day Smoker  . Smokeless tobacco: Not on file  . Alcohol Use: No   ROS as above Medications: No current facility-administered medications for this encounter.   Current Outpatient Prescriptions  Medication Sig Dispense Refill  . aspirin EC 81 MG tablet Take 81 mg by mouth daily.    Marland Kitchen atorvastatin (LIPITOR) 20 MG tablet Take 1 tablet (20 mg total) by mouth every evening. 90 tablet 3  . carvedilol (COREG) 12.5 MG tablet TAKE 1 TABLET BY MOUTH TWICE A DAY 60 tablet 7  . clopidogrel (PLAVIX) 75 MG tablet TAKE 1 TABLET BY MOUTH EVERY DAY (INS WILL COVER ON 4/9) 30 tablet 7  . lisinopril (PRINIVIL,ZESTRIL) 40 MG tablet TAKE 1 TABLET BY MOUTH EVERY DAY 30 tablet 7  . nitroGLYCERIN (NITROSTAT) 0.4 MG SL tablet Place 1 tablet (0.4 mg total) under the tongue every 5 (five) minutes as needed for chest pain. 25 tablet 2  . pantoprazole (PROTONIX) 40 MG tablet TAKE 1 TABLET BY MOUTH EVERY DAY 90 tablet 3   Allergies  Allergen Reactions  . Carvedilol Other (See Comments)    REACTION: can only take low doses  . Hydrochlorothiazide Other (See Comments)    Leg numbness.      Exam:  BP 158/79 mmHg  Pulse 107  Temp(Src) 103.7 F (39.8 C) (Oral)  Resp 16  SpO2 95% Gen: Well NAD HEENT: EOMI,  MMM Lungs: Normal work of breathing. CTABL Heart: Tachycardia no MRG Abd: NABS, Soft. Nondistended, tender palpation pelvis without masses palpated. No CV angle tenderness to percussion Exts: Brisk capillary refill, warm and well perfused.   No results found for this or any previous visit (from the past 24 hour(s)). No results found.  Assessment and Plan: 64 y.o. male with significant fever. Patient's temperature is 103.7 degrees and he has symptoms suggestive of urinary retention with UTI versus pyelonephritis. Transfer to emergency department for further evaluation and management.  Discussed warning signs or symptoms. Please see discharge instructions. Patient expresses understanding.     Gregor Hams, MD 09/28/14 [phone]

## 2014-09-28 NOTE — Progress Notes (Signed)
Attempted to receive report. RN calling back.   M.Forest Gleason, RN

## 2014-09-28 NOTE — ED Notes (Signed)
C/o couple of weeks duration of difficutly w urination, pain w urination.

## 2014-09-28 NOTE — ED Notes (Signed)
Pt c/o inability to urinate. Pt states he has a strong urge but is unable to output a large amount of urine. Pt reports bladder pain. UC sent pt over to be evaluated by EDP.

## 2014-09-29 ENCOUNTER — Inpatient Hospital Stay (HOSPITAL_COMMUNITY): Payer: 59

## 2014-09-29 ENCOUNTER — Encounter (HOSPITAL_COMMUNITY): Payer: Self-pay | Admitting: Internal Medicine

## 2014-09-29 DIAGNOSIS — A419 Sepsis, unspecified organism: Principal | ICD-10-CM

## 2014-09-29 DIAGNOSIS — I10 Essential (primary) hypertension: Secondary | ICD-10-CM

## 2014-09-29 DIAGNOSIS — R39198 Other difficulties with micturition: Secondary | ICD-10-CM | POA: Insufficient documentation

## 2014-09-29 DIAGNOSIS — R3989 Other symptoms and signs involving the genitourinary system: Secondary | ICD-10-CM

## 2014-09-29 DIAGNOSIS — E785 Hyperlipidemia, unspecified: Secondary | ICD-10-CM

## 2014-09-29 DIAGNOSIS — N390 Urinary tract infection, site not specified: Secondary | ICD-10-CM

## 2014-09-29 LAB — COMPREHENSIVE METABOLIC PANEL
ALT: 16 U/L — ABNORMAL LOW (ref 17–63)
ANION GAP: 10 (ref 5–15)
AST: 15 U/L (ref 15–41)
Albumin: 3.1 g/dL — ABNORMAL LOW (ref 3.5–5.0)
Alkaline Phosphatase: 53 U/L (ref 38–126)
BUN: 10 mg/dL (ref 6–20)
CHLORIDE: 105 mmol/L (ref 101–111)
CO2: 20 mmol/L — ABNORMAL LOW (ref 22–32)
Calcium: 7.4 mg/dL — ABNORMAL LOW (ref 8.9–10.3)
Creatinine, Ser: 1.1 mg/dL (ref 0.61–1.24)
GFR calc Af Amer: >60 mL/min (ref >60)
Glucose, Bld: 102 mg/dL — ABNORMAL HIGH (ref 70–99)
POTASSIUM: 3.1 mmol/L — AB (ref 3.5–5.1)
SODIUM: 135 mmol/L (ref 135–145)
Total Bilirubin: 0.6 mg/dL (ref 0.3–1.2)
Total Protein: 5.3 g/dL — ABNORMAL LOW (ref 6.5–8.1)

## 2014-09-29 LAB — CBC
HEMATOCRIT: 41.6 % (ref 39.0–52.0)
Hemoglobin: 14.2 g/dL (ref 13.0–17.0)
MCH: 29.7 pg (ref 26.0–34.0)
MCHC: 34.1 g/dL (ref 30.0–36.0)
MCV: 87 fL (ref 78.0–100.0)
Platelets: 179 10*3/uL (ref 150–400)
RBC: 4.78 MIL/uL (ref 4.22–5.81)
RDW: 12.7 % (ref 11.5–15.5)
WBC: 13.9 10*3/uL — AB (ref 4.0–10.5)

## 2014-09-29 LAB — MRSA PCR SCREENING: MRSA by PCR: NEGATIVE

## 2014-09-29 LAB — APTT: aPTT: 31 seconds (ref 24–37)

## 2014-09-29 LAB — PROTIME-INR
INR: 1.13 (ref 0.00–1.49)
PROTHROMBIN TIME: 14.7 s (ref 11.6–15.2)

## 2014-09-29 LAB — LACTIC ACID, PLASMA: Lactic Acid, Venous: 2 mmol/L (ref 0.5–2.0)

## 2014-09-29 LAB — CREATININE, SERUM
Creatinine, Ser: 1.14 mg/dL (ref 0.61–1.24)
GFR calc Af Amer: >60 mL/min (ref >60)
GFR calc non Af Amer: >60 mL/min (ref >60)

## 2014-09-29 LAB — CLOSTRIDIUM DIFFICILE BY PCR: Toxigenic C. Difficile by PCR: NEGATIVE

## 2014-09-29 LAB — PROCALCITONIN: Procalcitonin: 17.45 ng/mL

## 2014-09-29 MED ORDER — ENOXAPARIN SODIUM 40 MG/0.4ML ~~LOC~~ SOLN
40.0000 mg | Freq: Every day | SUBCUTANEOUS | Status: DC
  Administered 2014-09-29 – 2014-09-30 (×2): 40 mg via SUBCUTANEOUS
  Filled 2014-09-29 (×4): qty 0.4

## 2014-09-29 MED ORDER — NITROGLYCERIN 0.4 MG SL SUBL: 0.4000 mg | SUBLINGUAL_TABLET | SUBLINGUAL | Status: DC | PRN

## 2014-09-29 MED ORDER — ACETAMINOPHEN 325 MG PO TABS
650.0000 mg | ORAL_TABLET | Freq: Four times a day (QID) | ORAL | Status: DC | PRN
  Administered 2014-09-30: 650 mg via ORAL
  Filled 2014-09-29: qty 2

## 2014-09-29 MED ORDER — SODIUM CHLORIDE 0.9 % IV SOLN
INTRAVENOUS | Status: AC
  Administered 2014-09-29 (×2): via INTRAVENOUS

## 2014-09-29 MED ORDER — ONDANSETRON HCL 4 MG PO TABS: 4.0000 mg | ORAL_TABLET | Freq: Four times a day (QID) | ORAL | Status: DC | PRN

## 2014-09-29 MED ORDER — POTASSIUM CHLORIDE CRYS ER 20 MEQ PO TBCR
40.0000 meq | EXTENDED_RELEASE_TABLET | Freq: Once | ORAL | Status: AC
  Administered 2014-09-29: 40 meq via ORAL
  Filled 2014-09-29: qty 2

## 2014-09-29 MED ORDER — SODIUM CHLORIDE 0.9 % IV BOLUS (SEPSIS)
500.0000 mL | Freq: Once | INTRAVENOUS | Status: AC
  Administered 2014-09-29: 500 mL via INTRAVENOUS

## 2014-09-29 MED ORDER — ATORVASTATIN CALCIUM 20 MG PO TABS
20.0000 mg | ORAL_TABLET | Freq: Every evening | ORAL | Status: DC
  Administered 2014-09-29 – 2014-10-01 (×3): 20 mg via ORAL
  Filled 2014-09-29 (×4): qty 1

## 2014-09-29 MED ORDER — PIPERACILLIN-TAZOBACTAM 3.375 G IVPB
3.3750 g | Freq: Three times a day (TID) | INTRAVENOUS | Status: DC
  Administered 2014-09-29 – 2014-09-30 (×4): 3.375 g via INTRAVENOUS
  Filled 2014-09-29 (×6): qty 50

## 2014-09-29 MED ORDER — VANCOMYCIN HCL IN DEXTROSE 1-5 GM/200ML-% IV SOLN
1000.0000 mg | INTRAVENOUS | Status: AC
  Administered 2014-09-29: 1000 mg via INTRAVENOUS
  Filled 2014-09-29: qty 200

## 2014-09-29 MED ORDER — CARVEDILOL 12.5 MG PO TABS
12.5000 mg | ORAL_TABLET | Freq: Two times a day (BID) | ORAL | Status: DC
  Administered 2014-09-29 – 2014-10-02 (×7): 12.5 mg via ORAL
  Filled 2014-09-29 (×9): qty 1

## 2014-09-29 MED ORDER — VANCOMYCIN HCL IN DEXTROSE 1-5 GM/200ML-% IV SOLN
1000.0000 mg | Freq: Two times a day (BID) | INTRAVENOUS | Status: DC
  Administered 2014-09-29 – 2014-09-30 (×2): 1000 mg via INTRAVENOUS
  Filled 2014-09-29 (×3): qty 200

## 2014-09-29 MED ORDER — PANTOPRAZOLE SODIUM 40 MG PO TBEC
40.0000 mg | DELAYED_RELEASE_TABLET | Freq: Every day | ORAL | Status: DC
  Administered 2014-09-29 – 2014-10-01 (×3): 40 mg via ORAL
  Filled 2014-09-29 (×3): qty 1

## 2014-09-29 MED ORDER — ACETAMINOPHEN 650 MG RE SUPP: 650.0000 mg | Freq: Four times a day (QID) | RECTAL | Status: DC | PRN

## 2014-09-29 MED ORDER — SACCHAROMYCES BOULARDII 250 MG PO CAPS
250.0000 mg | ORAL_CAPSULE | Freq: Two times a day (BID) | ORAL | Status: DC
  Administered 2014-09-29 – 2014-10-02 (×6): 250 mg via ORAL
  Filled 2014-09-29 (×8): qty 1

## 2014-09-29 MED ORDER — PIPERACILLIN-TAZOBACTAM 3.375 G IVPB
3.3750 g | INTRAVENOUS | Status: AC
  Administered 2014-09-29: 3.375 g via INTRAVENOUS
  Filled 2014-09-29: qty 50

## 2014-09-29 MED ORDER — ASPIRIN EC 81 MG PO TBEC
81.0000 mg | DELAYED_RELEASE_TABLET | Freq: Every day | ORAL | Status: DC
  Administered 2014-09-29 – 2014-10-02 (×4): 81 mg via ORAL
  Filled 2014-09-29 (×4): qty 1

## 2014-09-29 MED ORDER — SODIUM CHLORIDE 0.9 % IV SOLN: INTRAVENOUS | Status: DC

## 2014-09-29 MED ORDER — CLOPIDOGREL BISULFATE 75 MG PO TABS
75.0000 mg | ORAL_TABLET | Freq: Every day | ORAL | Status: DC
  Administered 2014-09-29 – 2014-10-02 (×4): 75 mg via ORAL
  Filled 2014-09-29 (×4): qty 1

## 2014-09-29 MED ORDER — LOPERAMIDE HCL 2 MG PO CAPS
2.0000 mg | ORAL_CAPSULE | ORAL | Status: DC | PRN
  Administered 2014-09-29 – 2014-10-01 (×3): 2 mg via ORAL
  Filled 2014-09-29 (×3): qty 1

## 2014-09-29 MED ORDER — ONDANSETRON HCL 4 MG/2ML IJ SOLN: 4.0000 mg | Freq: Four times a day (QID) | INTRAMUSCULAR | Status: DC | PRN

## 2014-09-29 NOTE — Progress Notes (Signed)
[facility]                                                                              Patient Demographics  Alexander [name], is a 64 y.o. male, DOB - [birth year], SAY:[id number]  Admit date - 09/28/2014   Admitting Physician Rise Patience, MD  Outpatient Primary MD for the patient is [name], MD  LOS - 1   Chief Complaint  Patient presents with  . Urinary Retention  . Abdominal Pain      HPI on 09/29/2014 by Dr. Gean Birchwood Alexander [name] is a 64 y.o. male with history of CAD status post stenting, hypertension, hyperlipidemia presents to the ER because of fever chills sweating and difficulty urinating. Patient also has been having increasing frequency of urination. Patient has been having these symptoms over the last 24-48 hours. Patient also had suggested of some scrotal pain to patient's wife. In the ER patient was found to be febrile with temperatures around 103F and UA shows WBCs and bacteria. On exam patient's scrotum is not showing any skin changes and is not swollen and there is no discharge from the penis. There is no tenderness around the perineal area. Patient denies any abdominal pain nausea vomiting or diarrhea or any chest pain or shortness of breath. Patient has been admitted for sepsis secondary to UTI.   Assessment & Plan  Patient admitted earlier this morning.  Please refer to the full H&P.   Sepsis secondary to UTI -Patient was febrile with leukocytosis, tachycardia and tachypnea upon admission -Patient was having problems with urination including burning and difficulty upon admission however this has improved -Vitals appear to be improving. -UA: Moderate leukocytes, 11-20 WBC -CXR: No acute cardiopulmonary process -Renal US: Negative for hydronephrosis -Scrotal US: No testicular mass or torsion or scrotal fluid collection -Blood and Urine cultures pending -C. difficile PCR negative Lactic acid 2, procalcitonin 17.45 -Will continue to  monitor CBC. -Continue vancomycin and Zosyn  Hypertension -Lisinopril currently held -Continue Coreg -Patient had some hypotension upon admission however this has improved with fluid  History of Coronary artery disease status post stenting -Patient denies any current chest pain -Lisinopril currently held -Continue statin, aspirin, Plavix, Coreg  Hyperlipidemia -Continue statin  Hypokalemia -Will replace and continue to monitor BMP  Code Status: Full  Family Communication: Wife at bedside.   Disposition Plan: Admitted. If patient remains stable, will move to medical floor later today.   Time Spent in minutes   30 minutes  Procedures  Renal and Scrotal US  Consults   None  DVT Prophylaxis  Lovenox  Lab Results  Component Value Date   PLT 179 09/29/2014    Medications  Scheduled Meds: . aspirin EC  81 mg Oral Daily  . atorvastatin  20 mg Oral QPM  . carvedilol  12.5 mg Oral BID WC  . clopidogrel  75 mg Oral Daily  . enoxaparin (LOVENOX) injection  40 mg Subcutaneous Daily  . pantoprazole  40 mg Oral Daily  . piperacillin-tazobactam (ZOSYN)  IV  3.375 g Intravenous 3 times per day  . vancomycin  1,000 mg Intravenous Q12H   Continuous Infusions: . sodium chloride 125 mL/hr at  09/29/14 0427   PRN Meds:.acetaminophen **OR** acetaminophen, nitroGLYCERIN, ondansetron **OR** ondansetron (ZOFRAN) IV  Antibiotics    Anti-infectives    Start     Dose/Rate Route Frequency Ordered Stop   09/29/14 1400  vancomycin (VANCOCIN) IVPB 1000 mg/200 mL premix     1,000 mg 200 mL/hr over 60 Minutes Intravenous Every 12 hours 09/29/14 0218     09/29/14 0600  piperacillin-tazobactam (ZOSYN) IVPB 3.375 g     3.375 g 12.5 mL/hr over 240 Minutes Intravenous 3 times per day 09/29/14 0218     09/29/14 0115  piperacillin-tazobactam (ZOSYN) IVPB 3.375 g     3.375 g 12.5 mL/hr over 240 Minutes Intravenous NOW 09/29/14 0031 09/29/14 0536   09/29/14 0115  vancomycin (VANCOCIN) IVPB  1000 mg/200 mL premix     1,000 mg 200 mL/hr over 60 Minutes Intravenous NOW 09/29/14 0031 09/29/14 0236   09/28/14 2330  cefTRIAXone (ROCEPHIN) 2 g in dextrose 5 % 50 mL IVPB     2 g 100 mL/hr over 30 Minutes Intravenous  Once 09/28/14 2323 09/29/14 0009   09/28/14 2245  cefTRIAXone (ROCEPHIN) 1 g in dextrose 5 % 50 mL IVPB  Status:  Discontinued     1 g 100 mL/hr over 30 Minutes Intravenous  Once 09/28/14 2235 09/29/14 0006        Subjective:   [name] seen and examined today.  Patient feels his urination has improved.  He states he is able to empty out his bladder.  He feels the burning sensation has also improved.  He denies chest pain or shortness of breath, abdominal pain.  Patient states he is feeling tired.   Objective:   Filed Vitals:   09/29/14 0816 09/29/14 1000 09/29/14 1100 09/29/14 1200  BP: 111/64 108/49 106/50 118/50  Pulse: 102 103 96   Temp: 97.8 F (36.6 C)     TempSrc: Oral     Resp: 16 14 12    Height:      Weight:      SpO2: 95%       Wt Readings from Last 3 Encounters:  09/29/14 82 kg (180 lb 12.4 oz)  03/24/14 82.155 kg (181 lb 1.9 oz)  05/13/13 82.555 kg (182 lb)     Intake/Output Summary (Last 24 hours) at 09/29/14 1233 Last data filed at 09/29/14 0900  Gross per 24 hour  Intake   1175 ml  Output   1500 ml  Net   -325 ml    Exam  General: Well developed, well nourished, NAD, appears stated age  32: NCAT, mucous membranes moist.   Cardiovascular: S1 S2 auscultated, tachycardic  Respiratory: Clear to auscultation bilaterally with equal chest rise  Abdomen: Soft, nontender, nondistended, + bowel sounds  Extremities: warm dry without cyanosis clubbing or edema  Neuro: AAOx3, nonfocal  Psych: Normal affect and demeanor with intact judgement and insight  Data Review   Micro Results Recent Results (from the past 240 hour(s))  MRSA PCR Screening     Status: None   Collection Time: 09/29/14  1:10 AM  Result Value Ref  Range Status   MRSA by PCR NEGATIVE NEGATIVE Final    Comment:        The GeneXpert MRSA Assay (FDA approved for NASAL specimens only), is one component of a comprehensive MRSA colonization surveillance program. It is not intended to diagnose MRSA infection nor to guide or monitor treatment for MRSA infections.   Clostridium Difficile by PCR     Status: None  Collection Time: 09/29/14  9:39 AM  Result Value Ref Range Status   C difficile by pcr NEGATIVE NEGATIVE Final    Radiology Reports US Scrotum  09/29/2014   CLINICAL DATA:  Scrotal pain.  EXAM: SCROTAL ULTRASOUND  DOPPLER ULTRASOUND OF THE TESTICLES  TECHNIQUE: Complete ultrasound examination of the testicles, epididymis, and other scrotal structures was performed. Color and spectral Doppler ultrasound were also utilized to evaluate blood flow to the testicles.  COMPARISON:  None.  FINDINGS: Right testicle  Measurements: 5.1 x 2.9 x 2.9 cm. No mass or microlithiasis visualized.  Left testicle  Measurements: 4.1 x 2.5 x 2.7 cm. No mass or microlithiasis visualized.  Right epididymis:  Normal in size and appearance.  Left epididymis:  Normal in size and appearance.  Hydrocele:  None visualized.  Varicocele:  None visualized.  Pulsed Doppler interrogation of both testes demonstrates normal low resistance arterial and venous waveforms bilaterally.  IMPRESSION: No testicular mass or torsion. No abnormal scrotal fluid collections.   Electronically Signed   By: Andreas Newport M.D.   On: 09/29/2014 06:32   US Renal  09/29/2014   CLINICAL DATA:  Difficulty urinating  EXAM: RENAL / URINARY TRACT ULTRASOUND COMPLETE  COMPARISON:  None.  FINDINGS: Right Kidney:  Length: 11.5 cm. Echogenicity within normal limits. No mass or hydronephrosis visualized.  Left Kidney:  Length: 11.8 cm. Echogenicity within normal limits. No mass or hydronephrosis visualized.  Bladder:  Appears normal for degree of bladder distention.  IMPRESSION: Negative for  hydronephrosis.  No calculi are evident.   Electronically Signed   By: Andreas Newport M.D.   On: 09/29/2014 06:26   [place] Art/ven Flow Abd Pelv Doppler  09/29/2014   CLINICAL DATA:  Scrotal pain.  EXAM: SCROTAL ULTRASOUND  DOPPLER ULTRASOUND OF THE TESTICLES  TECHNIQUE: Complete ultrasound examination of the testicles, epididymis, and other scrotal structures was performed. Color and spectral Doppler ultrasound were also utilized to evaluate blood flow to the testicles.  COMPARISON:  None.  FINDINGS: Right testicle  Measurements: 5.1 x 2.9 x 2.9 cm. No mass or microlithiasis visualized.  Left testicle  Measurements: 4.1 x 2.5 x 2.7 cm. No mass or microlithiasis visualized.  Right epididymis:  Normal in size and appearance.  Left epididymis:  Normal in size and appearance.  Hydrocele:  None visualized.  Varicocele:  None visualized.  Pulsed Doppler interrogation of both testes demonstrates normal low resistance arterial and venous waveforms bilaterally.  IMPRESSION: No testicular mass or torsion. No abnormal scrotal fluid collections.   Electronically Signed   By: Andreas Newport M.D.   On: 09/29/2014 06:32   Dg Chest Portable 1 View  09/28/2014   CLINICAL DATA:  Fever to 103 .  Difficulty urinating.  EXAM: PORTABLE CHEST - 1 VIEW  COMPARISON:  Radiograph 03/09/2014  FINDINGS: Normal mediastinum and cardiac silhouette. Normal pulmonary vasculature. No evidence of effusion, infiltrate, or pneumothorax. No acute bony abnormality.  IMPRESSION: No acute cardiopulmonary process.   Electronically Signed   By: Suzy Bouchard M.D.   On: 09/28/2014 22:56    CBC  Recent Labs Lab 09/28/14 2025 09/29/14 0138  WBC 9.4 13.9*  HGB 15.7 14.2  HCT 46.6 41.6  PLT 220 179  MCV 87.6 87.0  MCH 29.5 29.7  MCHC 33.7 34.1  RDW 12.7 12.7  LYMPHSABS 0.5*  --   MONOABS 0.0*  --   EOSABS 0.1  --   BASOSABS 0.0  --     Chemistries   Recent Labs Lab  09/28/14 2025 09/29/14 0138  NA 133* 135  K 3.7 3.1*  CL  99* 105  CO2 23 20*  GLUCOSE 117* 102*  BUN 10 10  CREATININE 0.96 1.10  1.14  CALCIUM 8.8* 7.4*  AST 19 15  ALT 22 16*  ALKPHOS 77 53  BILITOT 0.6 0.6   ------------------------------------------------------------------------------------------------------------------ estimated creatinine clearance is 67.8 mL/min (by C-G formula based on Cr of 1.1). ------------------------------------------------------------------------------------------------------------------ No results for input(s): HGBA1C in the last 72 hours. ------------------------------------------------------------------------------------------------------------------ No results for input(s): CHOL, HDL, LDLCALC, TRIG, CHOLHDL, LDLDIRECT in the last 72 hours. ------------------------------------------------------------------------------------------------------------------ No results for input(s): TSH, T4TOTAL, T3FREE, THYROIDAB in the last 72 hours.  Invalid input(s): FREET3 ------------------------------------------------------------------------------------------------------------------ No results for input(s): VITAMINB12, FOLATE, FERRITIN, TIBC, IRON, RETICCTPCT in the last 72 hours.  Coagulation profile  Recent Labs Lab 09/29/14 0138  INR 1.13    No results for input(s): DDIMER in the last 72 hours.  Cardiac Enzymes No results for input(s): CKMB, TROPONINI, MYOGLOBIN in the last 168 hours.  Invalid input(s): CK ------------------------------------------------------------------------------------------------------------------ Invalid input(s): POCBNP    Taegen Lennox D.O. on 09/29/2014 at 12:33 PM  Between 7am to 7pm - Pager - [phone]  After 7pm go to [email or web address] - password [email or web address]  And look for the night coverage person covering for me after hours  [facility] Office  [phone]

## 2014-09-29 NOTE — Progress Notes (Signed)
ANTIBIOTIC CONSULT NOTE - INITIAL  Pharmacy Consult for Vancomycin and Zosyn Indication: Sepsis  Allergies  Allergen Reactions  . Carvedilol Other (See Comments)    REACTION: can only take low doses  . Hydrochlorothiazide Other (See Comments)    Leg numbness.    Patient Measurements: Height: 5\' 5"  (165.1 cm) Weight: 180 lb 12.4 oz (82 kg) IBW/kg (Calculated) : 61.5   Vital Signs: Temp: 98.6 F (37 C) (05/05 0100) Temp Source: Oral (05/05 0100) BP: 90/41 mmHg (05/05 0202) Pulse Rate: 91 (05/05 0202) Intake/Output from previous day: 05/04 0701 - 05/05 0700 In: -  Out: 450 [Urine:450] Intake/Output from this shift: Total I/O In: -  Out: 450 [Urine:450]  Labs:  Recent Labs  09/28/14 2025 09/29/14 0138  WBC 9.4 13.9*  HGB 15.7 14.2  PLT 220 179  CREATININE 0.96  --    Estimated Creatinine Clearance: 77.6 mL/min (by C-G formula based on Cr of 0.96). No results for input(s): VANCOTROUGH, VANCOPEAK, VANCORANDOM, GENTTROUGH, GENTPEAK, GENTRANDOM, TOBRATROUGH, TOBRAPEAK, TOBRARND, AMIKACINPEAK, AMIKACINTROU, AMIKACIN in the last 72 hours.   Microbiology: No results found for this or any previous visit (from the past 720 hour(s)).  Medical History: Past Medical History  Diagnosis Date  . Myocardial infarct      MYOCARDIAL INFARCTION, INFERIOR WALL, INITIAL EPISODE (ICD-410.41)RX OVERLAPPING  DRUG-ELUDTING STENTS RCA WITH RESIDUAL DISEASE LAD & CFX. EF INITIALLY 40% NOW 60-65%  . Hyperlipidemia   . HTN (hypertension)   . CAD (coronary artery disease)   . Anxiety   . GERD (gastroesophageal reflux disease)     Assessment: 64 y.o male with sepsis and UTI. Tmax 103.8 Tc 98.6, WBC 13.9K  Pharmacy consulted to dose IV vancomycin and zosyn for sepsis. SCr = 0.96, estimated CrCl ~77 ml/min.  Goal of Therapy:  Vancomycin trough level 10-15 mcg/ml  Plan:  I ordered Zosyn 3.375 gm IV x1 now dose which was given at 01:36 and vancomycin 1000 mg IV x1 now dose which was  given at 01:36. Continue Zosyn 3.375 gm IV q8h (infuse each dose over 4 hours) Vancomycin 1000 mg IV q12h Monitor clinical status, renal function, culture results daily.  Check steady state vancomycin trough if needed per protocol.  Thank you for allowing pharmacy to be part of this patients care team. Nicole Cella, RPh Clinical Pharmacist Pager: [phone] 09/29/2014,2:07 AM

## 2014-09-29 NOTE — H&P (Signed)
[facility] History and Physical  [name] MPN:[id number] DOB: 1951/05/20 DOA: 09/28/2014  Referring physician: Ms. [name]. PCP: [name], MD  Specialists: Patient sees Dr. Burt Knack cardiologist.  Chief Complaint: Increasing frequency of urination and difficulty urinating.  HPI: Alexander [name] is a 64 y.o. male with history of CAD status post stenting, hypertension, hyperlipidemia presents to the ER because of fever chills sweating and difficulty urinating. Patient also has been having increasing frequency of urination. Patient has been having these symptoms over the last 24-48 hours. Patient also had suggested of some scrotal pain to patient's wife. In the ER patient was found to be febrile with temperatures around 103F and UA shows WBCs and bacteria. On exam patient's scrotum is not showing any skin changes and is not swollen and there is no discharge from the penis. There is no tenderness around the perineal area. Patient denies any abdominal pain nausea vomiting or diarrhea or any chest pain or shortness of breath. Patient has been admitted for sepsis secondary to UTI.   Review of Systems: As presented in the history of presenting illness, rest negative.  Past Medical History  Diagnosis Date  . Myocardial infarct      MYOCARDIAL INFARCTION, INFERIOR WALL, INITIAL EPISODE (ICD-410.41)RX OVERLAPPING  DRUG-ELUDTING STENTS RCA WITH RESIDUAL DISEASE LAD & CFX. EF INITIALLY 40% NOW 60-65%  . Hyperlipidemia   . HTN (hypertension)   . CAD (coronary artery disease)   . Anxiety   . GERD (gastroesophageal reflux disease)    Past Surgical History  Procedure Laterality Date  . Cardiac catheterization    . Coronary angioplasty     Social History:  reports that he has been smoking.  He does not have any smokeless tobacco history on file. He reports that he does not drink alcohol or use illicit drugs. Where does patient live at home. Can patient participate in ADLs?  Yes.  Allergies  Allergen Reactions  . Carvedilol Other (See Comments)    REACTION: can only take low doses  . Hydrochlorothiazide Other (See Comments)    Leg numbness.    Family History:  Family History  Problem Relation Age of Onset  . Other      negative FH of diabetes, hypertension or coronary artery disease      Prior to Admission medications   Medication Sig Start Date End Date Taking? Authorizing Provider  aspirin EC 81 MG tablet Take 81 mg by mouth daily.    Historical Provider, MD  atorvastatin (LIPITOR) 20 MG tablet Take 1 tablet (20 mg total) by mouth every evening. 03/24/14   [name], MD  carvedilol (COREG) 12.5 MG tablet TAKE 1 TABLET BY MOUTH TWICE A DAY 09/09/14   [name], MD  clopidogrel (PLAVIX) 75 MG tablet TAKE 1 TABLET BY MOUTH EVERY DAY (INS WILL COVER ON 4/9) 09/09/14   [name], MD  lisinopril (PRINIVIL,ZESTRIL) 40 MG tablet TAKE 1 TABLET BY MOUTH EVERY DAY 09/09/14   [name], MD  nitroGLYCERIN (NITROSTAT) 0.4 MG SL tablet Place 1 tablet (0.4 mg total) under the tongue every 5 (five) minutes as needed for chest pain. 03/24/14   [name], MD  pantoprazole (PROTONIX) 40 MG tablet TAKE 1 TABLET BY MOUTH EVERY DAY 04/14/14   [name], MD    Physical Exam: Filed Vitals:   09/28/14 2146 09/28/14 2147 09/28/14 2200 09/28/14 2215  BP:  123/62 110/71 110/52  Pulse:  114 110 108  Temp: 102.9 F (39.4 C) 103.8 F (39.9 C)  TempSrc: Oral     Resp:  18 17 22   SpO2:  93% 94% 94%     General:  Well-developed and nourished.  Eyes: Anicteric no pallor.  ENT: No discharge from the ears eyes nose and mouth.  Neck: No mass felt.  Cardiovascular: S1-S2 heard.  Respiratory: No rhonchi or crepitations.  Abdomen: Soft nontender bowel sounds present. I do not see any swelling around the scrotal area or perianal tenderness.  Skin: No rash.  Musculoskeletal: No edema.  Psychiatric: Appears normal.  Neurologic: Alert awake  oriented to time place and person. Moves all extremities.  Labs on Admission:  Basic Metabolic Panel:  Recent Labs Lab 09/28/14 2025  NA 133*  K 3.7  CL 99*  CO2 23  GLUCOSE 117*  BUN 10  CREATININE 0.96  CALCIUM 8.8*   Liver Function Tests:  Recent Labs Lab 09/28/14 2025  AST 19  ALT 22  ALKPHOS 77  BILITOT 0.6  PROT 7.2  ALBUMIN 4.1   No results for input(s): LIPASE, AMYLASE in the last 168 hours. No results for input(s): AMMONIA in the last 168 hours. CBC:  Recent Labs Lab 09/28/14 2025  WBC 9.4  NEUTROABS 8.7*  HGB 15.7  HCT 46.6  MCV 87.6  PLT 220   Cardiac Enzymes: No results for input(s): CKTOTAL, CKMB, CKMBINDEX, TROPONINI in the last 168 hours.  BNP (last 3 results) No results for input(s): BNP in the last 8760 hours.  ProBNP (last 3 results) No results for input(s): PROBNP in the last 8760 hours.  CBG: No results for input(s): GLUCAP in the last 168 hours.  Radiological Exams on Admission: Dg Chest Portable 1 View  09/28/2014   CLINICAL DATA:  Fever to 103 .  Difficulty urinating.  EXAM: PORTABLE CHEST - 1 VIEW  COMPARISON:  Radiograph 03/09/2014  FINDINGS: Normal mediastinum and cardiac silhouette. Normal pulmonary vasculature. No evidence of effusion, infiltrate, or pneumothorax. No acute bony abnormality.  IMPRESSION: No acute cardiopulmonary process.   Electronically Signed   By: Suzy Bouchard M.D.   On: 09/28/2014 22:56    EKG: Independently reviewed. Sinus tachycardia.  Assessment/Plan Active Problems:   HYPERTENSION, BENIGN   Sepsis   UTI (lower urinary tract infection)   Hyperlipidemia   1. Sepsis from UTI - at this time I have placed patient on Zosyn and vancomycin. Follow blood cultures urine cultures. Since patient has difficulty urinating I have ordered a renal sonogram and scrotal sonogram. Closely observe and stepdown. Follow-up for calcitonin levels and lactic acid levels. 2. Hypertension - holding of lisinopril for now  we'll continue with Coreg. If patient's blood pressure decreases may have to hold off Coreg also. 3. CAD status post stenting - denies any chest pain. Continue statins and a dependent regions and Coreg for now. See #2. 4. Hyperlipidemia - continue statins.   DVT Prophylaxis Lovenox.  Code Status: Full code.  Family Communication: Discussed with patient's wife.  Disposition Plan: Admit to inpatient.    KAKRAKANDY,ARSHAD N. [facility] Pager [phone].  If 7PM-7AM, please contact night-coverage [email or web address] Password [email or web address] 09/29/2014, 12:28 AM

## 2014-09-30 LAB — CBC
HEMATOCRIT: 37.8 % — AB (ref 39.0–52.0)
HEMOGLOBIN: 12.6 g/dL — AB (ref 13.0–17.0)
MCH: 29.5 pg (ref 26.0–34.0)
MCHC: 33.3 g/dL (ref 30.0–36.0)
MCV: 88.5 fL (ref 78.0–100.0)
Platelets: 170 10*3/uL (ref 150–400)
RBC: 4.27 MIL/uL (ref 4.22–5.81)
RDW: 13.5 % (ref 11.5–15.5)
WBC: 13.9 10*3/uL — ABNORMAL HIGH (ref 4.0–10.5)

## 2014-09-30 LAB — BASIC METABOLIC PANEL
Anion gap: 8 (ref 5–15)
BUN: 10 mg/dL (ref 6–20)
CO2: 22 mmol/L (ref 22–32)
CREATININE: 1.03 mg/dL (ref 0.61–1.24)
Calcium: 7.6 mg/dL — ABNORMAL LOW (ref 8.9–10.3)
Chloride: 107 mmol/L (ref 101–111)
GFR calc non Af Amer: >60 mL/min (ref >60)
Glucose, Bld: 139 mg/dL — ABNORMAL HIGH (ref 70–99)
Potassium: 3.5 mmol/L (ref 3.5–5.1)
Sodium: 137 mmol/L (ref 135–145)

## 2014-09-30 LAB — PROCALCITONIN: PROCALCITONIN: 12.36 ng/mL

## 2014-09-30 MED ORDER — CEFTRIAXONE SODIUM IN DEXTROSE 20 MG/ML IV SOLN
1.0000 g | INTRAVENOUS | Status: DC
  Administered 2014-09-30 – 2014-10-02 (×3): 1 g via INTRAVENOUS
  Filled 2014-09-30 (×3): qty 50

## 2014-09-30 NOTE — Progress Notes (Signed)
[facility]                                                                              Patient Demographics  Alexander [name], is a 64 y.o. male, DOB - 1950/06/24, PNT:[id number]  Admit date - 09/28/2014   Admitting Physician Rise Patience, MD  Outpatient Primary MD for the patient is [name], MD  LOS - 2   Chief Complaint  Patient presents with  . Urinary Retention  . Abdominal Pain      HPI on 09/29/2014 by Dr. Gean Birchwood Alexander [name] is a 64 y.o. male with history of CAD status post stenting, hypertension, hyperlipidemia presents to the ER because of fever chills sweating and difficulty urinating. Patient also has been having increasing frequency of urination. Patient has been having these symptoms over the last 24-48 hours. Patient also had suggested of some scrotal pain to patient's wife. In the ER patient was found to be febrile with temperatures around 103F and UA shows WBCs and bacteria. On exam patient's scrotum is not showing any skin changes and is not swollen and there is no discharge from the penis. There is no tenderness around the perineal area. Patient denies any abdominal pain nausea vomiting or diarrhea or any chest pain or shortness of breath. Patient has been admitted for sepsis secondary to UTI.   Assessment & Plan   Sepsis secondary to UTI -Patient was febrile with leukocytosis, tachycardia and tachypnea upon admission -Patient was having problems with urination including burning and difficulty upon admission however this has improved -Vitals appear to be improving. -Lactic acid 2, procalcitonin 17.45 upon admission -UA: Moderate leukocytes, 11-20 WBC -CXR: No acute cardiopulmonary process -Renal US: Negative for hydronephrosis -Scrotal US: No testicular mass or torsion or scrotal fluid collection -Blood cultures pending -Urine culture 100K Ecoli, pending sensitivities -C. difficile PCR negative -Will continue to monitor  CBC. -Initially placed on vancomycin and Zosyn, will discontinue vancomycin today  Hypertension -Lisinopril currently held -Continue Coreg -Patient had some hypotension upon admission however this has improved with fluid  History of Coronary artery disease status post stenting -Patient denies any current chest pain -Lisinopril currently held -Continue statin, aspirin, Plavix, Coreg  Hyperlipidemia -Continue statin  Hypokalemia -Resolved, continue to monitor BMP and replace as needed  Code Status: Full  Family Communication: Wife at bedside.   Disposition Plan: Admitted. Will move to medical floor today.   Time Spent in minutes   30 minutes  Procedures  Renal and Scrotal US  Consults   None  DVT Prophylaxis  Lovenox  Lab Results  Component Value Date   PLT 170 09/30/2014    Medications  Scheduled Meds: . aspirin EC  81 mg Oral Daily  . atorvastatin  20 mg Oral QPM  . carvedilol  12.5 mg Oral BID WC  . clopidogrel  75 mg Oral Daily  . enoxaparin (LOVENOX) injection  40 mg Subcutaneous Daily  . pantoprazole  40 mg Oral Daily  . piperacillin-tazobactam (ZOSYN)  IV  3.375 g Intravenous 3 times per day  . saccharomyces boulardii  250 mg Oral BID  . vancomycin  1,000 mg Intravenous Q12H   Continuous Infusions:  PRN Meds:.acetaminophen **OR** acetaminophen, loperamide, nitroGLYCERIN, ondansetron **OR** ondansetron (ZOFRAN) IV  Antibiotics    Anti-infectives    Start     Dose/Rate Route Frequency Ordered Stop   09/29/14 1400  vancomycin (VANCOCIN) IVPB 1000 mg/200 mL premix     1,000 mg 200 mL/hr over 60 Minutes Intravenous Every 12 hours 09/29/14 0218     09/29/14 0600  piperacillin-tazobactam (ZOSYN) IVPB 3.375 g     3.375 g 12.5 mL/hr over 240 Minutes Intravenous 3 times per day 09/29/14 0218     09/29/14 0115  piperacillin-tazobactam (ZOSYN) IVPB 3.375 g     3.375 g 12.5 mL/hr over 240 Minutes Intravenous NOW 09/29/14 0031 09/29/14 0536   09/29/14  0115  vancomycin (VANCOCIN) IVPB 1000 mg/200 mL premix     1,000 mg 200 mL/hr over 60 Minutes Intravenous NOW 09/29/14 0031 09/29/14 0236   09/28/14 2330  cefTRIAXone (ROCEPHIN) 2 g in dextrose 5 % 50 mL IVPB     2 g 100 mL/hr over 30 Minutes Intravenous  Once 09/28/14 2323 09/29/14 0009   09/28/14 2245  cefTRIAXone (ROCEPHIN) 1 g in dextrose 5 % 50 mL IVPB  Status:  Discontinued     1 g 100 mL/hr over 30 Minutes Intravenous  Once 09/28/14 2235 09/29/14 0006        Subjective:   [name] seen and examined today.  Patient feels his urination has improved.  He also feels that his diarrhea has improved with imodium.  He feels overall better.  He complains of some abdominal soreness.   He denies chest pain or shortness of breath, abdominal pain, nausea and vomiting.    Objective:   Filed Vitals:   09/29/14 1757 09/29/14 2000 09/30/14 0007 09/30/14 0320  BP: 116/76 112/46 101/38 117/46  Pulse: 98 91 94 86  Temp:  100.5 F (38.1 C) 99.5 F (37.5 C) 99.5 F (37.5 C)  TempSrc:  Oral Oral Oral  Resp:  21 17 17   Height:      Weight:      SpO2:  97%      Wt Readings from Last 3 Encounters:  09/29/14 82 kg (180 lb 12.4 oz)  03/24/14 82.155 kg (181 lb 1.9 oz)  05/13/13 82.555 kg (182 lb)     Intake/Output Summary (Last 24 hours) at 09/30/14 0707 Last data filed at 09/30/14 1610  Gross per 24 hour  Intake   2910 ml  Output    600 ml  Net   2310 ml    Exam  General: Well developed, well nourished, no distress  HEENT: NCAT, mucous membranes moist.   Cardiovascular: S1 S2 auscultated, RRR  Respiratory: Clear to auscultation   Abdomen: Soft, nontender, nondistended, + bowel sounds  Extremities: warm dry without cyanosis clubbing or edema  Neuro: AAOx3, nonfocal  Psych: Normal affect and demeanor   Data Review   Micro Results Recent Results (from the past 240 hour(s))  Urine culture     Status: None (Preliminary result)   Collection Time: 09/28/14  9:17 PM   Result Value Ref Range Status   Specimen Description URINE, RANDOM  Final   Special Requests NONE  Final   Colony Count   Final    >=100,000 COLONIES/ML Performed at [place] Performed at [facility]    Report Status PENDING  Incomplete  MRSA PCR Screening     Status: None   Collection Time: 09/29/14  1:10 AM  Result Value Ref Range Status   MRSA by PCR NEGATIVE NEGATIVE Final    Comment:        The GeneXpert MRSA Assay (FDA approved for NASAL specimens only), is one component of a comprehensive MRSA colonization surveillance program. It is not intended to diagnose MRSA infection nor to guide or monitor treatment for MRSA infections.   Clostridium Difficile by PCR     Status: None   Collection Time: 09/29/14  9:39 AM  Result Value Ref Range Status   C difficile by pcr NEGATIVE NEGATIVE Final    Radiology Reports US Scrotum  09/29/2014   CLINICAL DATA:  Scrotal pain.  EXAM: SCROTAL ULTRASOUND  DOPPLER ULTRASOUND OF THE TESTICLES  TECHNIQUE: Complete ultrasound examination of the testicles, epididymis, and other scrotal structures was performed. Color and spectral Doppler ultrasound were also utilized to evaluate blood flow to the testicles.  COMPARISON:  None.  FINDINGS: Right testicle  Measurements: 5.1 x 2.9 x 2.9 cm. No mass or microlithiasis visualized.  Left testicle  Measurements: 4.1 x 2.5 x 2.7 cm. No mass or microlithiasis visualized.  Right epididymis:  Normal in size and appearance.  Left epididymis:  Normal in size and appearance.  Hydrocele:  None visualized.  Varicocele:  None visualized.  Pulsed Doppler interrogation of both testes demonstrates normal low resistance arterial and venous waveforms bilaterally.  IMPRESSION: No testicular mass or torsion. No abnormal scrotal fluid collections.   Electronically Signed   By: Andreas Newport M.D.   On: 09/29/2014 06:32   US Renal  09/29/2014   CLINICAL DATA:   Difficulty urinating  EXAM: RENAL / URINARY TRACT ULTRASOUND COMPLETE  COMPARISON:  None.  FINDINGS: Right Kidney:  Length: 11.5 cm. Echogenicity within normal limits. No mass or hydronephrosis visualized.  Left Kidney:  Length: 11.8 cm. Echogenicity within normal limits. No mass or hydronephrosis visualized.  Bladder:  Appears normal for degree of bladder distention.  IMPRESSION: Negative for hydronephrosis.  No calculi are evident.   Electronically Signed   By: Andreas Newport M.D.   On: 09/29/2014 06:26   [place] Art/ven Flow Abd Pelv Doppler  09/29/2014   CLINICAL DATA:  Scrotal pain.  EXAM: SCROTAL ULTRASOUND  DOPPLER ULTRASOUND OF THE TESTICLES  TECHNIQUE: Complete ultrasound examination of the testicles, epididymis, and other scrotal structures was performed. Color and spectral Doppler ultrasound were also utilized to evaluate blood flow to the testicles.  COMPARISON:  None.  FINDINGS: Right testicle  Measurements: 5.1 x 2.9 x 2.9 cm. No mass or microlithiasis visualized.  Left testicle  Measurements: 4.1 x 2.5 x 2.7 cm. No mass or microlithiasis visualized.  Right epididymis:  Normal in size and appearance.  Left epididymis:  Normal in size and appearance.  Hydrocele:  None visualized.  Varicocele:  None visualized.  Pulsed Doppler interrogation of both testes demonstrates normal low resistance arterial and venous waveforms bilaterally.  IMPRESSION: No testicular mass or torsion. No abnormal scrotal fluid collections.   Electronically Signed   By: Andreas Newport M.D.   On: 09/29/2014 06:32   Dg Chest Portable 1 View  09/28/2014   CLINICAL DATA:  Fever to 103 .  Difficulty urinating.  EXAM: PORTABLE CHEST - 1 VIEW  COMPARISON:  Radiograph 03/09/2014  FINDINGS: Normal mediastinum and cardiac silhouette. Normal pulmonary vasculature. No evidence of effusion, infiltrate, or pneumothorax. No acute bony abnormality.  IMPRESSION: No acute cardiopulmonary process.   Electronically Signed   By: Suzy Bouchard  M.D.   On:  09/28/2014 22:56    CBC  Recent Labs Lab 09/28/14 2025 09/29/14 0138 09/30/14 0326  WBC 9.4 13.9* 13.9*  HGB 15.7 14.2 12.6*  HCT 46.6 41.6 37.8*  PLT 220 179 170  MCV 87.6 87.0 88.5  MCH 29.5 29.7 29.5  MCHC 33.7 34.1 33.3  RDW 12.7 12.7 13.5  LYMPHSABS 0.5*  --   --   MONOABS 0.0*  --   --   EOSABS 0.1  --   --   BASOSABS 0.0  --   --     Chemistries   Recent Labs Lab 09/28/14 2025 09/29/14 0138 09/30/14 0326  NA 133* 135 137  K 3.7 3.1* 3.5  CL 99* 105 107  CO2 23 20* 22  GLUCOSE 117* 102* 139*  BUN 10 10 10   CREATININE 0.96 1.10  1.14 1.03  CALCIUM 8.8* 7.4* 7.6*  AST 19 15  --   ALT 22 16*  --   ALKPHOS 77 53  --   BILITOT 0.6 0.6  --    ------------------------------------------------------------------------------------------------------------------ estimated creatinine clearance is 72.4 mL/min (by C-G formula based on Cr of 1.03). ------------------------------------------------------------------------------------------------------------------ No results for input(s): HGBA1C in the last 72 hours. ------------------------------------------------------------------------------------------------------------------ No results for input(s): CHOL, HDL, LDLCALC, TRIG, CHOLHDL, LDLDIRECT in the last 72 hours. ------------------------------------------------------------------------------------------------------------------ No results for input(s): TSH, T4TOTAL, T3FREE, THYROIDAB in the last 72 hours.  Invalid input(s): FREET3 ------------------------------------------------------------------------------------------------------------------ No results for input(s): VITAMINB12, FOLATE, FERRITIN, TIBC, IRON, RETICCTPCT in the last 72 hours.  Coagulation profile  Recent Labs Lab 09/29/14 0138  INR 1.13    No results for input(s): DDIMER in the last 72 hours.  Cardiac Enzymes No results for input(s): CKMB, TROPONINI, MYOGLOBIN in the last 168  hours.  Invalid input(s): CK ------------------------------------------------------------------------------------------------------------------ Invalid input(s): POCBNP    Sanyiah Kanzler D.O. on 09/30/2014 at 7:07 AM  Between 7am to 7pm - Pager - [phone]  After 7pm go to [email or web address] - password [email or web address]  And look for the night coverage person covering for me after hours  [facility] Office  [phone]

## 2014-09-30 NOTE — Significant Event (Signed)
Report called to receiving RN on 5W, all patients belongings gathered and sent with patient.  Wife at bedside and aware of transfer.

## 2014-10-01 LAB — CBC
HEMATOCRIT: 39.7 % (ref 39.0–52.0)
Hemoglobin: 13.2 g/dL (ref 13.0–17.0)
MCH: 29 pg (ref 26.0–34.0)
MCHC: 33.2 g/dL (ref 30.0–36.0)
MCV: 87.3 fL (ref 78.0–100.0)
PLATELETS: 163 10*3/uL (ref 150–400)
RBC: 4.55 MIL/uL (ref 4.22–5.81)
RDW: 13 % (ref 11.5–15.5)
WBC: 12.4 10*3/uL — AB (ref 4.0–10.5)

## 2014-10-01 LAB — URINE CULTURE: Colony Count: >=100000

## 2014-10-01 LAB — BASIC METABOLIC PANEL
Anion gap: 5 (ref 5–15)
BUN: 6 mg/dL (ref 6–20)
CO2: 26 mmol/L (ref 22–32)
CREATININE: 0.96 mg/dL (ref 0.61–1.24)
Calcium: 8.3 mg/dL — ABNORMAL LOW (ref 8.9–10.3)
Chloride: 107 mmol/L (ref 101–111)
GFR calc Af Amer: >60 mL/min (ref >60)
GFR calc non Af Amer: >60 mL/min (ref >60)
Glucose, Bld: 101 mg/dL — ABNORMAL HIGH (ref 70–99)
Potassium: 4.2 mmol/L (ref 3.5–5.1)
SODIUM: 138 mmol/L (ref 135–145)

## 2014-10-01 NOTE — Progress Notes (Signed)
[facility]                                                                              Patient Demographics  Alexander [name], is a 64 y.o. male, DOB - Jul 10, 1950, PZW:[id number]  Admit date - 09/28/2014   Admitting Physician Rise Patience, MD  Outpatient Primary MD for the patient is [name], MD  LOS - 3   Chief Complaint  Patient presents with  . Urinary Retention  . Abdominal Pain      HPI on 09/29/2014 by Dr. Gean Birchwood Alexander [name] is a 64 y.o. male with history of CAD status post stenting, hypertension, hyperlipidemia presents to the ER because of fever chills sweating and difficulty urinating. Patient also has been having increasing frequency of urination. Patient has been having these symptoms over the last 24-48 hours. Patient also had suggested of some scrotal pain to patient's wife. In the ER patient was found to be febrile with temperatures around 103F and UA shows WBCs and bacteria. On exam patient's scrotum is not showing any skin changes and is not swollen and there is no discharge from the penis. There is no tenderness around the perineal area. Patient denies any abdominal pain nausea vomiting or diarrhea or any chest pain or shortness of breath. Patient has been admitted for sepsis secondary to UTI.   Assessment & Plan   Sepsis secondary to UTI -Patient was febrile with leukocytosis, tachycardia and tachypnea upon admission -Patient was having problems with urination including burning and difficulty upon admission however this has resolved -Continues to have fever, Tmax 101.2 over past 24hours Lactic acid 2, procalcitonin 17.45 upon admission -UA: Moderate leukocytes, 11-20 WBC -CXR: No acute cardiopulmonary process -Renal US: Negative for hydronephrosis -Scrotal US: No testicular mass or torsion or scrotal fluid collection -Blood cultures show no growth to date -Urine culture 100K Ecoli, pending sensitivities (will speak to micro regarding  sensitivities) -C. difficile PCR negative -Will continue to monitor CBC. Leukocytosis mildly improving, 12.4. -Initially placed on vancomycin and Zosyn, transitioned to ceftriaxone  Hypertension -Lisinopril currently held- but will restart. -Continue Coreg -Patient had some hypotension upon admission however this has improved with fluid  History of Coronary artery disease status post stenting -Patient denies any current chest pain -Lisinopril currently held, however, will restart -Continue statin, aspirin, Plavix, Coreg  Hyperlipidemia -Continue statin  Hypokalemia -Resolved, continue to monitor BMP and replace as needed  Code Status: Full  Family Communication: Wife at bedside.   Disposition Plan: Admitted. Continue to monitor and treat.  Pending sensitivities.     Time Spent in minutes   30 minutes  Procedures  Renal and Scrotal US  Consults   None  DVT Prophylaxis  Lovenox  Lab Results  Component Value Date   PLT 163 10/01/2014    Medications  Scheduled Meds: . aspirin EC  81 mg Oral Daily  . atorvastatin  20 mg Oral QPM  . carvedilol  12.5 mg Oral BID WC  . cefTRIAXone (ROCEPHIN)  IV  1 g Intravenous Q24H  . clopidogrel  75 mg Oral Daily  . enoxaparin (LOVENOX) injection  40 mg Subcutaneous Daily  . pantoprazole  40 mg  Oral Daily  . saccharomyces boulardii  250 mg Oral BID   Continuous Infusions:   PRN Meds:.acetaminophen **OR** acetaminophen, loperamide, nitroGLYCERIN, ondansetron **OR** ondansetron (ZOFRAN) IV  Antibiotics    Anti-infectives    Start     Dose/Rate Route Frequency Ordered Stop   09/30/14 1400  cefTRIAXone (ROCEPHIN) 1 g in dextrose 5 % 50 mL IVPB - Premix     1 g 100 mL/hr over 30 Minutes Intravenous Every 24 hours 09/30/14 1135     09/29/14 1400  vancomycin (VANCOCIN) IVPB 1000 mg/200 mL premix  Status:  Discontinued     1,000 mg 200 mL/hr over 60 Minutes Intravenous Every 12 hours 09/29/14 0218 09/30/14 1123   09/29/14 0600   piperacillin-tazobactam (ZOSYN) IVPB 3.375 g  Status:  Discontinued     3.375 g 12.5 mL/hr over 240 Minutes Intravenous 3 times per day 09/29/14 0218 09/30/14 1135   09/29/14 0115  piperacillin-tazobactam (ZOSYN) IVPB 3.375 g     3.375 g 12.5 mL/hr over 240 Minutes Intravenous NOW 09/29/14 0031 09/29/14 0536   09/29/14 0115  vancomycin (VANCOCIN) IVPB 1000 mg/200 mL premix     1,000 mg 200 mL/hr over 60 Minutes Intravenous NOW 09/29/14 0031 09/29/14 0236   09/28/14 2330  cefTRIAXone (ROCEPHIN) 2 g in dextrose 5 % 50 mL IVPB     2 g 100 mL/hr over 30 Minutes Intravenous  Once 09/28/14 2323 09/29/14 0009   09/28/14 2245  cefTRIAXone (ROCEPHIN) 1 g in dextrose 5 % 50 mL IVPB  Status:  Discontinued     1 g 100 mL/hr over 30 Minutes Intravenous  Once 09/28/14 2235 09/29/14 0006        Subjective:   Alexander [name] seen and examined today.  Patient feels his is abdominal soreness has improved.  He feels his diarrhea has also slowed down.  Denies nausea or vomiting, chest pain, shortness of breath, headache, dizziness.  No longer has burning with urination.   Objective:   Filed Vitals:   09/30/14 1555 09/30/14 2156 09/30/14 2340 10/01/14 0558  BP: 147/79 138/76  152/88  Pulse: 88 92  94  Temp: 99.1 F (37.3 C) 101.2 F (38.4 C) 99.9 F (37.7 C) 99.9 F (37.7 C)  TempSrc: Oral Oral Oral Oral  Resp: 18 14  18   Height:      Weight:      SpO2: 96% 94%  95%    Wt Readings from Last 3 Encounters:  09/29/14 82 kg (180 lb 12.4 oz)  03/24/14 82.155 kg (181 lb 1.9 oz)  05/13/13 82.555 kg (182 lb)     Intake/Output Summary (Last 24 hours) at 10/01/14 1108 Last data filed at 10/01/14 0914  Gross per 24 hour  Intake    792 ml  Output    450 ml  Net    342 ml    Exam  General: Well developed, well nourished, no distress  HEENT: NCAT, mucous membranes moist.   Cardiovascular: S1 S2 auscultated, RRR  Respiratory: Clear to auscultation   Abdomen: Soft, obese, nontender,  nondistended, + bowel sounds  Extremities: warm dry without cyanosis clubbing or edema  Neuro: AAOx3, nonfocal  Psych: Pleasant, appropriate mood and affect  Data Review   Micro Results Recent Results (from the past 240 hour(s))  Culture, blood (routine x 2)     Status: None (Preliminary result)   Collection Time: 09/28/14  8:25 PM  Result Value Ref Range Status   Specimen Description BLOOD RIGHT ARM  Final  Special Requests BOTTLES DRAWN AEROBIC AND ANAEROBIC 5CC  Final   Culture   Final           BLOOD CULTURE RECEIVED NO GROWTH TO DATE CULTURE WILL BE HELD FOR 5 DAYS BEFORE ISSUING A FINAL NEGATIVE REPORT Performed at [facility]    Report Status PENDING  Incomplete  Culture, blood (routine x 2)     Status: None (Preliminary result)   Collection Time: 09/28/14  8:25 PM  Result Value Ref Range Status   Specimen Description BLOOD LEFT ARM  Final   Special Requests BOTTLES DRAWN AEROBIC AND ANAEROBIC 5CC  Final   Culture   Final           BLOOD CULTURE RECEIVED NO GROWTH TO DATE CULTURE WILL BE HELD FOR 5 DAYS BEFORE ISSUING A FINAL NEGATIVE REPORT Performed at [facility]    Report Status PENDING  Incomplete  Urine culture     Status: None (Preliminary result)   Collection Time: 09/28/14  9:17 PM  Result Value Ref Range Status   Specimen Description URINE, RANDOM  Final   Special Requests NONE  Final   Colony Count   Final    >=100,000 COLONIES/ML Performed at [facility]    Culture   Final    ESCHERICHIA COLI Performed at [facility]    Report Status PENDING  Incomplete  MRSA PCR Screening     Status: None   Collection Time: 09/29/14  1:10 AM  Result Value Ref Range Status   MRSA by PCR NEGATIVE NEGATIVE Final    Comment:        The GeneXpert MRSA Assay (FDA approved for NASAL specimens only), is one component of a comprehensive MRSA colonization surveillance program. It is not intended to diagnose MRSA infection nor to  guide or monitor treatment for MRSA infections.   Clostridium Difficile by PCR     Status: None   Collection Time: 09/29/14  9:39 AM  Result Value Ref Range Status   C difficile by pcr NEGATIVE NEGATIVE Final    Radiology Reports US Scrotum  09/29/2014   CLINICAL DATA:  Scrotal pain.  EXAM: SCROTAL ULTRASOUND  DOPPLER ULTRASOUND OF THE TESTICLES  TECHNIQUE: Complete ultrasound examination of the testicles, epididymis, and other scrotal structures was performed. Color and spectral Doppler ultrasound were also utilized to evaluate blood flow to the testicles.  COMPARISON:  None.  FINDINGS: Right testicle  Measurements: 5.1 x 2.9 x 2.9 cm. No mass or microlithiasis visualized.  Left testicle  Measurements: 4.1 x 2.5 x 2.7 cm. No mass or microlithiasis visualized.  Right epididymis:  Normal in size and appearance.  Left epididymis:  Normal in size and appearance.  Hydrocele:  None visualized.  Varicocele:  None visualized.  Pulsed Doppler interrogation of both testes demonstrates normal low resistance arterial and venous waveforms bilaterally.  IMPRESSION: No testicular mass or torsion. No abnormal scrotal fluid collections.   Electronically Signed   By: Andreas Newport M.D.   On: 09/29/2014 06:32   US Renal  09/29/2014   CLINICAL DATA:  Difficulty urinating  EXAM: RENAL / URINARY TRACT ULTRASOUND COMPLETE  COMPARISON:  None.  FINDINGS: Right Kidney:  Length: 11.5 cm. Echogenicity within normal limits. No mass or hydronephrosis visualized.  Left Kidney:  Length: 11.8 cm. Echogenicity within normal limits. No mass or hydronephrosis visualized.  Bladder:  Appears normal for degree of bladder distention.  IMPRESSION: Negative for hydronephrosis.  No calculi are evident.  Electronically Signed   By: Andreas Newport M.D.   On: 09/29/2014 06:26   [place] Art/ven Flow Abd Pelv Doppler  09/29/2014   CLINICAL DATA:  Scrotal pain.  EXAM: SCROTAL ULTRASOUND  DOPPLER ULTRASOUND OF THE TESTICLES  TECHNIQUE: Complete  ultrasound examination of the testicles, epididymis, and other scrotal structures was performed. Color and spectral Doppler ultrasound were also utilized to evaluate blood flow to the testicles.  COMPARISON:  None.  FINDINGS: Right testicle  Measurements: 5.1 x 2.9 x 2.9 cm. No mass or microlithiasis visualized.  Left testicle  Measurements: 4.1 x 2.5 x 2.7 cm. No mass or microlithiasis visualized.  Right epididymis:  Normal in size and appearance.  Left epididymis:  Normal in size and appearance.  Hydrocele:  None visualized.  Varicocele:  None visualized.  Pulsed Doppler interrogation of both testes demonstrates normal low resistance arterial and venous waveforms bilaterally.  IMPRESSION: No testicular mass or torsion. No abnormal scrotal fluid collections.   Electronically Signed   By: Andreas Newport M.D.   On: 09/29/2014 06:32   Dg Chest Portable 1 View  09/28/2014   CLINICAL DATA:  Fever to 103 .  Difficulty urinating.  EXAM: PORTABLE CHEST - 1 VIEW  COMPARISON:  Radiograph 03/09/2014  FINDINGS: Normal mediastinum and cardiac silhouette. Normal pulmonary vasculature. No evidence of effusion, infiltrate, or pneumothorax. No acute bony abnormality.  IMPRESSION: No acute cardiopulmonary process.   Electronically Signed   By: Suzy Bouchard M.D.   On: 09/28/2014 22:56    CBC  Recent Labs Lab 09/28/14 2025 09/29/14 0138 09/30/14 0326 10/01/14 0707  WBC 9.4 13.9* 13.9* 12.4*  HGB 15.7 14.2 12.6* 13.2  HCT 46.6 41.6 37.8* 39.7  PLT 220 179 170 163  MCV 87.6 87.0 88.5 87.3  MCH 29.5 29.7 29.5 29.0  MCHC 33.7 34.1 33.3 33.2  RDW 12.7 12.7 13.5 13.0  LYMPHSABS 0.5*  --   --   --   MONOABS 0.0*  --   --   --   EOSABS 0.1  --   --   --   BASOSABS 0.0  --   --   --     Chemistries   Recent Labs Lab 09/28/14 2025 09/29/14 0138 09/30/14 0326 10/01/14 0707  NA 133* 135 137 138  K 3.7 3.1* 3.5 4.2  CL 99* 105 107 107  CO2 23 20* 22 26  GLUCOSE 117* 102* 139* 101*  BUN 10 10 10 6     CREATININE 0.96 1.10  1.14 1.03 0.96  CALCIUM 8.8* 7.4* 7.6* 8.3*  AST 19 15  --   --   ALT 22 16*  --   --   ALKPHOS 77 53  --   --   BILITOT 0.6 0.6  --   --    ------------------------------------------------------------------------------------------------------------------ estimated creatinine clearance is 77.6 mL/min (by C-G formula based on Cr of 0.96). ------------------------------------------------------------------------------------------------------------------ No results for input(s): HGBA1C in the last 72 hours. ------------------------------------------------------------------------------------------------------------------ No results for input(s): CHOL, HDL, LDLCALC, TRIG, CHOLHDL, LDLDIRECT in the last 72 hours. ------------------------------------------------------------------------------------------------------------------ No results for input(s): TSH, T4TOTAL, T3FREE, THYROIDAB in the last 72 hours.  Invalid input(s): FREET3 ------------------------------------------------------------------------------------------------------------------ No results for input(s): VITAMINB12, FOLATE, FERRITIN, TIBC, IRON, RETICCTPCT in the last 72 hours.  Coagulation profile  Recent Labs Lab 09/29/14 0138  INR 1.13    No results for input(s): DDIMER in the last 72 hours.  Cardiac Enzymes No results for input(s): CKMB, TROPONINI, MYOGLOBIN in the last 168 hours.  Invalid input(s): CK ------------------------------------------------------------------------------------------------------------------  Invalid input(s): POCBNP    Abayomi Pattison D.O. on 10/01/2014 at 11:08 AM  Between 7am to 7pm - Pager - [phone]  After 7pm go to [email or web address] - password [email or web address]  And look for the night coverage person covering for me after hours  [facility] Office  [phone]

## 2014-10-02 LAB — CBC
HCT: 39.2 % (ref 39.0–52.0)
HEMOGLOBIN: 13.3 g/dL (ref 13.0–17.0)
MCH: 29.1 pg (ref 26.0–34.0)
MCHC: 33.9 g/dL (ref 30.0–36.0)
MCV: 85.8 fL (ref 78.0–100.0)
PLATELETS: 195 10*3/uL (ref 150–400)
RBC: 4.57 MIL/uL (ref 4.22–5.81)
RDW: 12.8 % (ref 11.5–15.5)
WBC: 7.9 10*3/uL (ref 4.0–10.5)

## 2014-10-02 MED ORDER — CIPROFLOXACIN HCL 500 MG PO TABS: 500.0000 mg | ORAL_TABLET | Freq: Two times a day (BID) | ORAL | Status: DC

## 2014-10-02 MED ORDER — SACCHAROMYCES BOULARDII 250 MG PO CAPS: 250.0000 mg | ORAL_CAPSULE | Freq: Two times a day (BID) | ORAL | Status: DC

## 2014-10-02 NOTE — Discharge Instructions (Signed)

## 2014-10-02 NOTE — Plan of Care (Signed)
Problem: Phase I Progression Outcomes Goal: OOB as tolerated unless otherwise ordered Outcome: Completed/Met Date Met:  10/02/14 independent

## 2014-10-02 NOTE — Progress Notes (Signed)
Reviewed discharge instructions with pt and wife.  Pt denied any other needs at this time.  PIV removed.  Pt taken to discharge location via wheelchair.

## 2014-10-02 NOTE — Discharge Summary (Signed)
Physician Discharge Summary  Alexander [name] TKW:[id number] DOB: [birth year] DOA: 09/28/2014  PCP: [name], MD  Admit date: 09/28/2014 Discharge date: 10/02/2014  Time spent: 45 minutes  Recommendations for Outpatient Follow-up:  Patient will be discharged to home.  Patient will need to follow up with primary care provider within one week of discharge.  Patient should continue medications as prescribed.  Patient should follow a heart healty diet. May resume activity as tolerated.  Discharge Diagnoses:  Secondary UTI Hypertension History of coronary artery disease status post stenting Hyperlipidemia Hypokalemia  Discharge Condition: Stable  Diet recommendation: Heart healthy  Filed Weights   09/29/14 0100  Weight: 82 kg (180 lb 12.4 oz)    History of present illness:  on 09/29/2014 by Dr. Gean Birchwood Alexander [name] is a 64 y.o. male with history of CAD status post stenting, hypertension, hyperlipidemia presents to the ER because of fever chills sweating and difficulty urinating. Patient also has been having increasing frequency of urination. Patient has been having these symptoms over the last 24-48 hours. Patient also had suggested of some scrotal pain to patient's wife. In the ER patient was found to be febrile with temperatures around 103F and UA shows WBCs and bacteria. On exam patient's scrotum is not showing any skin changes and is not swollen and there is no discharge from the penis. There is no tenderness around the perineal area. Patient denies any abdominal pain nausea vomiting or diarrhea or any chest pain or shortness of breath. Patient has been admitted for sepsis secondary to UTI.   Hospital Course:  Sepsis secondary to UTI -Patient was febrile with leukocytosis, tachycardia and tachypnea upon admission;Lactic acid 2, procalcitonin 17.45 upon admission -Patient was having problems with urination including burning and difficulty upon admission however this has  resolved -Currently afebrile, leukocytosis resolved -UA: Moderate leukocytes, 11-20 WBC -CXR: No acute cardiopulmonary process -Renal US: Negative for hydronephrosis -Scrotal US: No testicular mass or torsion or scrotal fluid collection -Blood cultures show no growth to date -C. difficile PCR negative -Initially placed on vancomycin and Zosyn, transitioned to ceftriaxone -Urine culture 100K Ecoli, pansensitive -Will discharge patient with Cipro 500mg  BID for an additional 4 days as well as Florastor  Hypertension -Continue Coreg and lisinopril -Patient had some hypotension upon admission however this has improved with fluid  History of Coronary artery disease status post stenting -Patient denies any current chest pain -Continue statin, aspirin, Plavix, Coreg, lisinopril  Hyperlipidemia -Continue statin  Hypokalemia -Resolved, continue to monitor BMP and replace as needed  Procedures  Renal and Scrotal US  Consults  None  Discharge Exam: Filed Vitals:   10/02/14 0538  BP: 154/78  Pulse: 83  Temp: 98.7 F (37.1 C)  Resp: 18   Exam  General: Well developed, well nourished, no distress  HEENT: NCAT, mucous membranes moist.   Cardiovascular: S1 S2 auscultated, RRR  Respiratory: Clear to auscultation  Abdomen: Soft, obese, nontender, nondistended, + bowel sounds  Extremities: warm dry without cyanosis clubbing or edema  Neuro: AAOx3, nonfocal  Psych: Pleasant, appropriate mood and affect  Discharge Instructions  Discharge Instructions    Discharge instructions    Complete by:  As directed   Patient will be discharged to home.  Patient will need to follow up with primary care provider within one week of discharge.  Patient should continue medications as prescribed.  Patient should follow a heart healty diet. May resume activity as tolerated.  Medication List    TAKE these medications        aspirin EC 81 MG tablet  Take 81 mg by mouth  daily.     atorvastatin 20 MG tablet  Commonly known as:  LIPITOR  Take 1 tablet (20 mg total) by mouth every evening.     carvedilol 12.5 MG tablet  Commonly known as:  COREG  TAKE 1 TABLET BY MOUTH TWICE A DAY     ciprofloxacin 500 MG tablet  Commonly known as:  CIPRO  Take 1 tablet (500 mg total) by mouth 2 (two) times daily.     clopidogrel 75 MG tablet  Commonly known as:  PLAVIX  TAKE 1 TABLET BY MOUTH EVERY DAY (INS WILL COVER ON 4/9)     lisinopril 10 MG tablet  Commonly known as:  PRINIVIL,ZESTRIL  Take 10 mg by mouth daily.     nitroGLYCERIN 0.4 MG SL tablet  Commonly known as:  NITROSTAT  Place 1 tablet (0.4 mg total) under the tongue every 5 (five) minutes as needed for chest pain.     pantoprazole 40 MG tablet  Commonly known as:  PROTONIX  TAKE 1 TABLET BY MOUTH EVERY DAY     saccharomyces boulardii 250 MG capsule  Commonly known as:  FLORASTOR  Take 1 capsule (250 mg total) by mouth 2 (two) times daily.       Allergies  Allergen Reactions  . Carvedilol Other (See Comments)    REACTION: can only take low doses  . Hydrochlorothiazide Other (See Comments)    Leg numbness.       Follow-up Information    Follow up with [name], MD. Schedule an appointment as soon as possible for a visit in 1 week.   Specialty:  Cardiology   Why:  Hospital follow up   Contact information:   [address] [place] [address] [phone]        The results of significant diagnostics from this hospitalization (including imaging, microbiology, ancillary and laboratory) are listed below for reference.    Significant Diagnostic Studies: US Scrotum  09/29/2014   CLINICAL DATA:  Scrotal pain.  EXAM: SCROTAL ULTRASOUND  DOPPLER ULTRASOUND OF THE TESTICLES  TECHNIQUE: Complete ultrasound examination of the testicles, epididymis, and other scrotal structures was performed. Color and spectral Doppler ultrasound were also utilized to evaluate blood  flow to the testicles.  COMPARISON:  None.  FINDINGS: Right testicle  Measurements: 5.1 x 2.9 x 2.9 cm. No mass or microlithiasis visualized.  Left testicle  Measurements: 4.1 x 2.5 x 2.7 cm. No mass or microlithiasis visualized.  Right epididymis:  Normal in size and appearance.  Left epididymis:  Normal in size and appearance.  Hydrocele:  None visualized.  Varicocele:  None visualized.  Pulsed Doppler interrogation of both testes demonstrates normal low resistance arterial and venous waveforms bilaterally.  IMPRESSION: No testicular mass or torsion. No abnormal scrotal fluid collections.   Electronically Signed   By: Andreas Newport M.D.   On: 09/29/2014 06:32   US Renal  09/29/2014   CLINICAL DATA:  Difficulty urinating  EXAM: RENAL / URINARY TRACT ULTRASOUND COMPLETE  COMPARISON:  None.  FINDINGS: Right Kidney:  Length: 11.5 cm. Echogenicity within normal limits. No mass or hydronephrosis visualized.  Left Kidney:  Length: 11.8 cm. Echogenicity within normal limits. No mass or hydronephrosis visualized.  Bladder:  Appears normal for degree of bladder distention.  IMPRESSION: Negative for hydronephrosis.  No calculi are evident.  Electronically Signed   By: Andreas Newport M.D.   On: 09/29/2014 06:26   [place] Art/ven Flow Abd Pelv Doppler  09/29/2014   CLINICAL DATA:  Scrotal pain.  EXAM: SCROTAL ULTRASOUND  DOPPLER ULTRASOUND OF THE TESTICLES  TECHNIQUE: Complete ultrasound examination of the testicles, epididymis, and other scrotal structures was performed. Color and spectral Doppler ultrasound were also utilized to evaluate blood flow to the testicles.  COMPARISON:  None.  FINDINGS: Right testicle  Measurements: 5.1 x 2.9 x 2.9 cm. No mass or microlithiasis visualized.  Left testicle  Measurements: 4.1 x 2.5 x 2.7 cm. No mass or microlithiasis visualized.  Right epididymis:  Normal in size and appearance.  Left epididymis:  Normal in size and appearance.  Hydrocele:  None visualized.  Varicocele:  None  visualized.  Pulsed Doppler interrogation of both testes demonstrates normal low resistance arterial and venous waveforms bilaterally.  IMPRESSION: No testicular mass or torsion. No abnormal scrotal fluid collections.   Electronically Signed   By: Andreas Newport M.D.   On: 09/29/2014 06:32   Dg Chest Portable 1 View  09/28/2014   CLINICAL DATA:  Fever to 103 .  Difficulty urinating.  EXAM: PORTABLE CHEST - 1 VIEW  COMPARISON:  Radiograph 03/09/2014  FINDINGS: Normal mediastinum and cardiac silhouette. Normal pulmonary vasculature. No evidence of effusion, infiltrate, or pneumothorax. No acute bony abnormality.  IMPRESSION: No acute cardiopulmonary process.   Electronically Signed   By: Suzy Bouchard M.D.   On: 09/28/2014 22:56    Microbiology: Recent Results (from the past 240 hour(s))  Culture, blood (routine x 2)     Status: None (Preliminary result)   Collection Time: 09/28/14  8:25 PM  Result Value Ref Range Status   Specimen Description BLOOD RIGHT ARM  Final   Special Requests BOTTLES DRAWN AEROBIC AND ANAEROBIC 5CC  Final   Culture   Final           BLOOD CULTURE RECEIVED NO GROWTH TO DATE CULTURE WILL BE HELD FOR 5 DAYS BEFORE ISSUING A FINAL NEGATIVE REPORT Performed at [facility]    Report Status PENDING  Incomplete  Culture, blood (routine x 2)     Status: None (Preliminary result)   Collection Time: 09/28/14  8:25 PM  Result Value Ref Range Status   Specimen Description BLOOD LEFT ARM  Final   Special Requests BOTTLES DRAWN AEROBIC AND ANAEROBIC 5CC  Final   Culture   Final           BLOOD CULTURE RECEIVED NO GROWTH TO DATE CULTURE WILL BE HELD FOR 5 DAYS BEFORE ISSUING A FINAL NEGATIVE REPORT Performed at [facility]    Report Status PENDING  Incomplete  Urine culture     Status: None   Collection Time: 09/28/14  9:17 PM  Result Value Ref Range Status   Specimen Description URINE, RANDOM  Final   Special Requests NONE  Final   Colony Count   Final     >=100,000 COLONIES/ML Performed at [facility]    Culture   Final    ESCHERICHIA COLI Performed at [facility]    Report Status 10/01/2014 FINAL  Final   Organism ID, Bacteria ESCHERICHIA COLI  Final      Susceptibility   Escherichia coli - MIC*    AMPICILLIN 4 SENSITIVE Sensitive     CEFAZOLIN <=4 SENSITIVE Sensitive     CEFTRIAXONE <=1 SENSITIVE Sensitive     CIPROFLOXACIN <=0.25 SENSITIVE Sensitive  GENTAMICIN <=1 SENSITIVE Sensitive     LEVOFLOXACIN <=0.12 SENSITIVE Sensitive     NITROFURANTOIN <=16 SENSITIVE Sensitive     TOBRAMYCIN <=1 SENSITIVE Sensitive     TRIMETH/SULFA <=20 SENSITIVE Sensitive     PIP/TAZO <=4 SENSITIVE Sensitive     * ESCHERICHIA COLI  MRSA PCR Screening     Status: None   Collection Time: 09/29/14  1:10 AM  Result Value Ref Range Status   MRSA by PCR NEGATIVE NEGATIVE Final    Comment:        The GeneXpert MRSA Assay (FDA approved for NASAL specimens only), is one component of a comprehensive MRSA colonization surveillance program. It is not intended to diagnose MRSA infection nor to guide or monitor treatment for MRSA infections.   Clostridium Difficile by PCR     Status: None   Collection Time: 09/29/14  9:39 AM  Result Value Ref Range Status   C difficile by pcr NEGATIVE NEGATIVE Final     Labs: Basic Metabolic Panel:  Recent Labs Lab 09/28/14 2025 09/29/14 0138 09/30/14 0326 10/01/14 0707  NA 133* 135 137 138  K 3.7 3.1* 3.5 4.2  CL 99* 105 107 107  CO2 23 20* 22 26  GLUCOSE 117* 102* 139* 101*  BUN 10 10 10 6   CREATININE 0.96 1.10  1.14 1.03 0.96  CALCIUM 8.8* 7.4* 7.6* 8.3*   Liver Function Tests:  Recent Labs Lab 09/28/14 2025 09/29/14 0138  AST 19 15  ALT 22 16*  ALKPHOS 77 53  BILITOT 0.6 0.6  PROT 7.2 5.3*  ALBUMIN 4.1 3.1*   No results for input(s): LIPASE, AMYLASE in the last 168 hours. No results for input(s): AMMONIA in the last 168 hours. CBC:  Recent Labs Lab  09/28/14 2025 09/29/14 0138 09/30/14 0326 10/01/14 0707 10/02/14 0626  WBC 9.4 13.9* 13.9* 12.4* 7.9  NEUTROABS 8.7*  --   --   --   --   HGB 15.7 14.2 12.6* 13.2 13.3  HCT 46.6 41.6 37.8* 39.7 39.2  MCV 87.6 87.0 88.5 87.3 85.8  PLT 220 179 170 163 195   Cardiac Enzymes: No results for input(s): CKTOTAL, CKMB, CKMBINDEX, TROPONINI in the last 168 hours. BNP: BNP (last 3 results) No results for input(s): BNP in the last 8760 hours.  ProBNP (last 3 results) No results for input(s): PROBNP in the last 8760 hours.  CBG: No results for input(s): GLUCAP in the last 168 hours.     SignedCristal Ford  [facility] 10/02/2014, 9:27 AM

## 2014-10-05 LAB — CULTURE, BLOOD (ROUTINE X 2)
CULTURE: NO GROWTH
Culture: NO GROWTH

## 2014-10-25 ENCOUNTER — Other Ambulatory Visit: Payer: Self-pay | Admitting: Cardiovascular Disease

## 2014-10-27 ENCOUNTER — Other Ambulatory Visit: Payer: Self-pay | Admitting: Cardiovascular Disease

## 2015-01-17 ENCOUNTER — Other Ambulatory Visit: Payer: Self-pay | Admitting: *Deleted

## 2015-01-17 ENCOUNTER — Other Ambulatory Visit: Payer: Self-pay

## 2015-01-17 ENCOUNTER — Telehealth: Payer: Self-pay

## 2015-01-17 MED ORDER — CLOPIDOGREL BISULFATE 75 MG PO TABS: 75.0000 mg | ORAL_TABLET | Freq: Every day | ORAL | Status: DC

## 2015-01-17 MED ORDER — CARVEDILOL 12.5 MG PO TABS: 12.5000 mg | ORAL_TABLET | Freq: Two times a day (BID) | ORAL | Status: DC

## 2015-01-17 MED ORDER — LISINOPRIL 10 MG PO TABS: 10.0000 mg | ORAL_TABLET | Freq: Every day | ORAL | Status: DC

## 2015-01-17 NOTE — Telephone Encounter (Signed)
[name], MD at 03/24/2014 11:29 AM  carvedilol (COREG) 12.5 MG tablet Take 12.5 mg by mouth 2 (two) times daily with a meal         Name: LAKSHYA, MCGILLICUDDY MRN: [id number] Date: 04/10/2015 Status: Sch Time: 8:45 AM Length: 15 Visit Type: OFFICE VISIT [[id number]]  Pt requesting 90 day supply

## 2015-01-17 NOTE — Telephone Encounter (Signed)
Called and told Alexander [name] a scheduler would be calling him to make f/u hospital visit. Explained that hospital had dropped his Lisinopril from 40 MG to 10 MG and he needs to keep a blood pressure log until his appointment. I sent staff message to scheduler to have appt scheduled with available PA/NP. Also pt stated he has Lisinopril 10 mg right now and does not need any at this time.

## 2015-01-17 NOTE — Telephone Encounter (Signed)
Alexander Mocha, MD at 03/24/2014 11:29 AM      clopidogrel (PLAVIX) 75 MG tablet Take 75 mg by mouth every morning         PT requesting 90 day supply.

## 2015-04-10 ENCOUNTER — Encounter: Payer: Self-pay | Admitting: Cardiovascular Disease

## 2015-04-10 ENCOUNTER — Ambulatory Visit (INDEPENDENT_AMBULATORY_CARE_PROVIDER_SITE_OTHER): Payer: 59 | Admitting: Cardiovascular Disease

## 2015-04-10 VITALS — BP 160/100 | HR 73 | Ht 65.0 in | Wt 169.0 lb

## 2015-04-10 DIAGNOSIS — I2510 Atherosclerotic heart disease of native coronary artery without angina pectoris: Secondary | ICD-10-CM | POA: Diagnosis not present

## 2015-04-10 DIAGNOSIS — I10 Essential (primary) hypertension: Secondary | ICD-10-CM | POA: Diagnosis not present

## 2015-04-10 DIAGNOSIS — E785 Hyperlipidemia, unspecified: Secondary | ICD-10-CM | POA: Diagnosis not present

## 2015-04-10 NOTE — Patient Instructions (Signed)
Medication Instructions:  Your physician recommends that you continue on your current medications as directed. Please refer to the Current Medication list given to you today.  Labwork: Your physician recommends that you return for a FASTING LIPID, LIVER, BMP and CBC in January 2017--nothing to eat or drink after midnight, lab opens at 7:30 AM  Testing/Procedures: No new orders.   Follow-Up: Your physician wants you to follow-up in: 1 YEAR with Dr Burt Knack.  You will receive a reminder letter in the mail two months in advance. If you don't receive a letter, please call our office to schedule the follow-up appointment.   Any Other Special Instructions Will Be Listed Below (If Applicable).     If you need a refill on your cardiac medications before your next appointment, please call your pharmacy.

## 2015-04-10 NOTE — Progress Notes (Signed)
Cardiology Office Note Date:  04/10/2015   ID:  Alexander [name], DOB 1950-11-25, MRN [id number]  PCP:  [name], MD  Cardiologist:  [name], MD    Chief Complaint  Patient presents with  . Follow-up    CAD    History of Present Illness: Alexander [name] is a 64 y.o. male who presents for follow-up of coronary artery disease, hypertension, and hyperlipidemia. Patient initially presented in 2008 with an acute inferior wall MI, treated with primary PCI using overlapping Taxus drug-eluting stents the right coronary artery. He has been maintained on long-term dual antiplatelet therapy with aspirin and Plavix. Patient has long-standing whitecoat hypertension. Since he was seen last year, he was hospitalized with urosepsis in May 2016. He required ICU care for a short period of time, hospitalized for 5 days.  He denies cardiac complaints or problems since he was seen one year ago. Today, he denies symptoms of palpitations, chest pain, shortness of breath, orthopnea, PND, lower extremity edema, dizziness, or syncope.  He lost his appetite after hospitalization, states food tasted 'metallic.' BP has been running in the 120's at home. BP was low during the hospitalization.  He plans on retiring later this year. His son is just back from serving in the [employer].   Past Medical History  Diagnosis Date  . Myocardial infarct (HCC)      MYOCARDIAL INFARCTION, INFERIOR WALL, INITIAL EPISODE (ICD-410.41)RX OVERLAPPING  DRUG-ELUDTING STENTS RCA WITH RESIDUAL DISEASE LAD & CFX. EF INITIALLY 40% NOW 60-65%  . Hyperlipidemia   . HTN (hypertension)   . CAD (coronary artery disease)   . Anxiety   . GERD (gastroesophageal reflux disease)     Past Surgical History  Procedure Laterality Date  . Cardiac catheterization    . Coronary angioplasty      Current Outpatient Prescriptions  Medication Sig Dispense Refill  . aspirin EC 81 MG tablet Take 81 mg by mouth daily.    Marland Kitchen  atorvastatin (LIPITOR) 20 MG tablet Take 1 tablet (20 mg total) by mouth every evening. 90 tablet 3  . carvedilol (COREG) 12.5 MG tablet Take 1 tablet (12.5 mg total) by mouth 2 (two) times daily. With meal 180 tablet 0  . clopidogrel (PLAVIX) 75 MG tablet Take 1 tablet (75 mg total) by mouth daily. 90 tablet 0  . lisinopril (PRINIVIL,ZESTRIL) 40 MG tablet Take 40 mg by mouth daily.  7  . nitroGLYCERIN (NITROSTAT) 0.4 MG SL tablet Place 1 tablet (0.4 mg total) under the tongue every 5 (five) minutes as needed for chest pain. 25 tablet 2  . pantoprazole (PROTONIX) 40 MG tablet TAKE 1 TABLET BY MOUTH EVERY DAY 90 tablet 1  . saccharomyces boulardii (FLORASTOR) 250 MG capsule Take 1 capsule (250 mg total) by mouth 2 (two) times daily. 30 capsule 0   No current facility-administered medications for this visit.    Allergies:   Carvedilol and Hydrochlorothiazide   Social History:  The patient  reports that he has been smoking.  He does not have any smokeless tobacco history on file. He reports that he does not drink alcohol or use illicit drugs.   Family History:  The patient's  family history includes Diabetes in his father; Heart disease in his mother; Other in an other family member.    ROS:  Please see the history of present illness.  Otherwise, review of systems is positive for weight loss, loss of appetite.  All other systems are reviewed and negative.  PHYSICAL EXAM: VS:  BP 160/100 mmHg  Pulse 73  Ht 5\' 5"  (1.651 m)  Wt 169 lb (76.658 kg)  BMI 28.12 kg/m2  SpO2 96% , BMI Body mass index is 28.12 kg/(m^2). GEN: Well nourished, well developed, in no acute distress HEENT: normal Neck: no JVD, no masses. No carotid bruits Cardiac: RRR without murmur or gallop                Respiratory:  clear to auscultation bilaterally, normal work of breathing GI: soft, nontender, nondistended, + BS MS: no deformity or atrophy Ext: no pretibial edema, pedal pulses 2+= bilaterally Skin: warm and  dry, no rash Neuro:  Strength and sensation are intact Psych: euthymic mood, full affect  EKG:  EKG is ordered today. The ekg ordered today shows NSR 73 bpm, within normal limits  Recent Labs: 09/29/2014: ALT 16* 10/01/2014: BUN 6; Creatinine, Ser 0.96; Potassium 4.2; Sodium 138 10/02/2014: Hemoglobin 13.3; Platelets 195   Lipid Panel     Component Value Date/Time   CHOL 144 06/07/2014 0741   TRIG 276.0* 06/07/2014 0741   HDL 31.10* 06/07/2014 0741   CHOLHDL 5 06/07/2014 0741   VLDL 55.2* 06/07/2014 0741   LDLCALC 53 04/28/2012 0936   LDLDIRECT 64.7 06/07/2014 0741      Wt Readings from Last 3 Encounters:  04/10/15 169 lb (76.658 kg)  09/29/14 180 lb 12.4 oz (82 kg)  03/24/14 181 lb 1.9 oz (82.155 kg)    ASSESSMENT AND PLAN: 1.  CAD, native vessel: remains stable without symptoms of angina.  He will continue on aspirin, a statin drug, carvedilol, and lisinopril.  2. Essential hypertension, long-standing whitecoat hypertension:  Home blood pressures are in range. He has not tolerated increase of carvedilol or lisinopril due to marked fatigue, dizziness, and symptoms related to hypotension. Will continue current medications.  3. Hyperlipidemia:  Lifestyle modification reviewed. Continue statin drug. Repeat lipids and LFTs in January.  4. Tobacco abuse:  Long-standing,  He has not been interested in quitting. He has been counseled repeatedly over the years.  Current medicines are reviewed with the patient today.  The patient does not have concerns regarding medicines.  Labs/ tests ordered today include:  No orders of the defined types were placed in this encounter.   Disposition:   FU one year. Labs in January including lipids and LFT's.  Deatra James, MD  04/10/2015 9:20 AM    [facility] [place], [place], [place]  [address] Phone: [phone]; Fax: [phone]

## 2015-04-11 ENCOUNTER — Other Ambulatory Visit: Payer: Self-pay | Admitting: Cardiovascular Disease

## 2015-04-13 ENCOUNTER — Other Ambulatory Visit: Payer: Self-pay | Admitting: Cardiovascular Disease

## 2015-04-25 ENCOUNTER — Other Ambulatory Visit: Payer: Self-pay | Admitting: Cardiovascular Disease

## 2015-04-29 ENCOUNTER — Other Ambulatory Visit: Payer: Self-pay | Admitting: Cardiovascular Disease

## 2015-05-01 NOTE — Telephone Encounter (Signed)
[name], MD at 04/10/2015 8:01 AM  clopidogrel (PLAVIX) 75 MG tabletTake 1 tablet (75 mg total) by mouth daily Medication Instructions:  Your physician recommends that you continue on your current medications as directed. Please refer to the Current Medication list given to you today

## 2015-05-11 ENCOUNTER — Other Ambulatory Visit: Payer: Self-pay | Admitting: Cardiovascular Disease

## 2015-05-20 ENCOUNTER — Emergency Department (HOSPITAL_COMMUNITY)
Admission: EM | Admit: 2015-05-20 | Discharge: 2015-05-20 | Disposition: A | Discharge status: Home / Self Care | Payer: 59 | Source: Home / Self Care | Attending: Emergency Medicine | Admitting: Emergency Medicine

## 2015-05-20 ENCOUNTER — Encounter (HOSPITAL_COMMUNITY): Payer: Self-pay | Admitting: *Deleted

## 2015-05-20 DIAGNOSIS — J018 Other acute sinusitis: Secondary | ICD-10-CM | POA: Diagnosis not present

## 2015-05-20 MED ORDER — HYDROCODONE-ACETAMINOPHEN 5-325 MG PO TABS: 1.0000 | ORAL_TABLET | Freq: Four times a day (QID) | ORAL | Status: DC | PRN

## 2015-05-20 MED ORDER — AZITHROMYCIN 250 MG PO TABS: ORAL_TABLET | ORAL | Status: DC

## 2015-05-20 NOTE — ED Notes (Signed)
Pt  Reports  Symptoms    Of  Pain   And   Sinus  Congestion   Stuffy       Nose       History  Of         Sinus  Infections  In  The  Past           denys  Any    Cough      She is   Sitting    Upright  And   Speaking  In  Complete  sentances

## 2015-05-20 NOTE — ED Provider Notes (Signed)
CSN: [id number]     Arrival date & time 05/20/15  1348 History   First MD Initiated Contact with Patient 05/20/15 1421     Chief Complaint  Patient presents with  . Facial Pain   (Consider location/radiation/quality/duration/timing/severity/associated sxs/prior Treatment) HPI  He is a 64 year old man here for evaluation of sinus pain. He reports about a week of gradually worsening right maxillary sinus pain. The pain radiates to his right upper teeth. It is particularly bad when he touches his teeth together. He will also feel it sometimes in his right ear. He denies any fevers, nasal congestion, rhinorrhea, sore throat, or cough. He states he has the same thing about once a year. He does also have a history of poor dentition with multiple root canals.  He does have an appointment with his oral surgeon in January.  Past Medical History  Diagnosis Date  . Myocardial infarct (HCC)      MYOCARDIAL INFARCTION, INFERIOR WALL, INITIAL EPISODE (ICD-410.41)RX OVERLAPPING  DRUG-ELUDTING STENTS RCA WITH RESIDUAL DISEASE LAD & CFX. EF INITIALLY 40% NOW 60-65%  . Hyperlipidemia   . HTN (hypertension)   . CAD (coronary artery disease)   . Anxiety   . GERD (gastroesophageal reflux disease)    Past Surgical History  Procedure Laterality Date  . Cardiac catheterization    . Coronary angioplasty     Family History  Problem Relation Age of Onset  . Other      negative FH of diabetes, hypertension or coronary artery disease  . Heart disease Mother   . Diabetes Father    Social History  Substance Use Topics  . Smoking status: Current Every Day Smoker  . Smokeless tobacco: Not on file  . Alcohol Use: No    Review of Systems As in history of present illness Allergies  Carvedilol and Hydrochlorothiazide  Home Medications   Prior to Admission medications   Medication Sig Start Date End Date Taking? Authorizing Provider  aspirin EC 81 MG tablet Take 81 mg by mouth daily.    Historical  Provider, MD  atorvastatin (LIPITOR) 20 MG tablet TAKE 1 TABLET BY MOUTH EVERY EVENING. 04/12/15   [name], MD  azithromycin (ZITHROMAX Z-PAK) 250 MG tablet Take 2 pills today, then 1 pill daily until gone. 05/20/15   Melony Overly, MD  carvedilol (COREG) 12.5 MG tablet Take 1 tablet (12.5 mg total) by mouth 2 (two) times daily with a meal. 04/26/15   [name], MD  clopidogrel (PLAVIX) 75 MG tablet TAKE 1 TABLET EVERY DAY 05/01/15   [name], MD  HYDROcodone-acetaminophen (NORCO) 5-325 MG tablet Take 1 tablet by mouth every 6 (six) hours as needed for moderate pain. 05/20/15   Melony Overly, MD  lisinopril (PRINIVIL,ZESTRIL) 40 MG tablet Take 40 mg by mouth daily. 02/11/15   Historical Provider, MD  lisinopril (PRINIVIL,ZESTRIL) 40 MG tablet TAKE 1 TABLET BY MOUTH EVERY DAY 05/11/15   [name], MD  nitroGLYCERIN (NITROSTAT) 0.4 MG SL tablet Place 1 tablet (0.4 mg total) under the tongue every 5 (five) minutes as needed for chest pain. 03/24/14   [name], MD  pantoprazole (PROTONIX) 40 MG tablet TAKE 1 TABLET BY MOUTH EVERY DAY 10/26/14   [name], MD  saccharomyces boulardii (FLORASTOR) 250 MG capsule Take 1 capsule (250 mg total) by mouth 2 (two) times daily. 10/02/14   Maryann Mikhail, DO   Meds Ordered and Administered this Visit  Medications - No data to display  BP 187/113 mmHg  Pulse 72  Temp(Src) 97.6 F (36.4 C) (Oral)  Resp 18  SpO2 97% No data found.   Physical Exam  Constitutional: He is oriented to person, place, and time. He appears well-developed and well-nourished. No distress.  HENT:  Lots of dental work. No focal erythema or tenderness to suggest dental infection. He is somewhat tender over the right maxillary sinus. Nasal mucosa is normal. TMs are normal bilaterally.  Neck: Neck supple.  Cardiovascular: Normal rate.   Pulmonary/Chest: Effort normal.  Lymphadenopathy:    He has no cervical adenopathy.  Neurological: He is alert and  oriented to person, place, and time.    ED Course  Procedures (including critical care time)  Labs Review Labs Reviewed - No data to display  Imaging Review No results found.   MDM   1. Other acute sinusitis    Treat with azithromycin. Prescription given for Norco to use as needed for pain. His blood pressure is elevated, that he states it is always elevated when he goes to the doctor. Per his cardiologist most recent note, he does have white coat hypertension.    Melony Overly, MD 05/20/15 [phone]

## 2015-05-20 NOTE — Discharge Instructions (Signed)
You have a sinus infection. Take azithromycin as prescribed. Use Tylenol or ibuprofen as needed for pain. If you need something stronger, fill the prescription for Norco. Do not drive while taking this medicine. Follow-up as needed.

## 2015-06-06 ENCOUNTER — Other Ambulatory Visit: Payer: 59

## 2015-06-09 ENCOUNTER — Other Ambulatory Visit (INDEPENDENT_AMBULATORY_CARE_PROVIDER_SITE_OTHER): Payer: 59 | Admitting: *Deleted

## 2015-06-09 DIAGNOSIS — I2510 Atherosclerotic heart disease of native coronary artery without angina pectoris: Secondary | ICD-10-CM | POA: Diagnosis not present

## 2015-06-09 DIAGNOSIS — E785 Hyperlipidemia, unspecified: Secondary | ICD-10-CM

## 2015-06-09 DIAGNOSIS — I10 Essential (primary) hypertension: Secondary | ICD-10-CM

## 2015-06-09 LAB — CBC
HCT: 45.9 % (ref 39.0–52.0)
HEMOGLOBIN: 15.3 g/dL (ref 13.0–17.0)
MCH: 29.4 pg (ref 26.0–34.0)
MCHC: 33.3 g/dL (ref 30.0–36.0)
MCV: 88.3 fL (ref 78.0–100.0)
MPV: 9.7 fL (ref 8.6–12.4)
Platelets: 295 10*3/uL (ref 150–400)
RBC: 5.2 MIL/uL (ref 4.22–5.81)
RDW: 13.2 % (ref 11.5–15.5)
WBC: 7.7 10*3/uL (ref 4.0–10.5)

## 2015-06-09 LAB — BASIC METABOLIC PANEL
BUN: 11 mg/dL (ref 7–25)
CO2: 26 mmol/L (ref 20–31)
CREATININE: 0.79 mg/dL (ref 0.70–1.25)
Calcium: 9.4 mg/dL (ref 8.6–10.3)
Chloride: 103 mmol/L (ref 98–110)
GLUCOSE: 104 mg/dL — AB (ref 65–99)
POTASSIUM: 4.2 mmol/L (ref 3.5–5.3)
Sodium: 137 mmol/L (ref 135–146)

## 2015-06-09 LAB — HEPATIC FUNCTION PANEL
ALBUMIN: 4.3 g/dL (ref 3.6–5.1)
ALT: 12 U/L (ref 9–46)
AST: 12 U/L (ref 10–35)
Alkaline Phosphatase: 77 U/L (ref 40–115)
BILIRUBIN TOTAL: 0.5 mg/dL (ref 0.2–1.2)
Bilirubin, Direct: 0.1 mg/dL (ref <=0.2)
Indirect Bilirubin: 0.4 mg/dL (ref 0.2–1.2)
Total Protein: 6.8 g/dL (ref 6.1–8.1)

## 2015-07-18 ENCOUNTER — Other Ambulatory Visit: Payer: Self-pay

## 2015-07-18 MED ORDER — LISINOPRIL 40 MG PO TABS: 40.0000 mg | ORAL_TABLET | Freq: Every day | ORAL | Status: DC

## 2015-10-15 ENCOUNTER — Other Ambulatory Visit: Payer: Self-pay | Admitting: Cardiovascular Disease

## 2015-10-29 ENCOUNTER — Emergency Department (HOSPITAL_COMMUNITY): Payer: Medicare Other

## 2015-10-29 ENCOUNTER — Emergency Department (HOSPITAL_COMMUNITY)
Admission: EM | Admit: 2015-10-29 | Discharge: 2015-10-29 | Disposition: A | Discharge status: Home / Self Care | Payer: Medicare Other | Attending: Emergency Medicine | Admitting: Emergency Medicine

## 2015-10-29 ENCOUNTER — Encounter (HOSPITAL_COMMUNITY): Payer: Self-pay

## 2015-10-29 DIAGNOSIS — I2510 Atherosclerotic heart disease of native coronary artery without angina pectoris: Secondary | ICD-10-CM | POA: Diagnosis not present

## 2015-10-29 DIAGNOSIS — F17200 Nicotine dependence, unspecified, uncomplicated: Secondary | ICD-10-CM | POA: Insufficient documentation

## 2015-10-29 DIAGNOSIS — I252 Old myocardial infarction: Secondary | ICD-10-CM | POA: Diagnosis not present

## 2015-10-29 DIAGNOSIS — L989 Disorder of the skin and subcutaneous tissue, unspecified: Secondary | ICD-10-CM | POA: Insufficient documentation

## 2015-10-29 DIAGNOSIS — Z7982 Long term (current) use of aspirin: Secondary | ICD-10-CM | POA: Insufficient documentation

## 2015-10-29 DIAGNOSIS — L03113 Cellulitis of right upper limb: Secondary | ICD-10-CM | POA: Diagnosis not present

## 2015-10-29 DIAGNOSIS — I10 Essential (primary) hypertension: Secondary | ICD-10-CM | POA: Insufficient documentation

## 2015-10-29 DIAGNOSIS — L988 Other specified disorders of the skin and subcutaneous tissue: Secondary | ICD-10-CM | POA: Diagnosis not present

## 2015-10-29 DIAGNOSIS — Z792 Long term (current) use of antibiotics: Secondary | ICD-10-CM | POA: Diagnosis not present

## 2015-10-29 DIAGNOSIS — Z79899 Other long term (current) drug therapy: Secondary | ICD-10-CM | POA: Diagnosis not present

## 2015-10-29 DIAGNOSIS — R0789 Other chest pain: Secondary | ICD-10-CM | POA: Diagnosis not present

## 2015-10-29 LAB — BASIC METABOLIC PANEL
ANION GAP: 5 (ref 5–15)
BUN: 9 mg/dL (ref 6–20)
CALCIUM: 9 mg/dL (ref 8.9–10.3)
CO2: 25 mmol/L (ref 22–32)
CREATININE: 0.88 mg/dL (ref 0.61–1.24)
Chloride: 106 mmol/L (ref 101–111)
GLUCOSE: 109 mg/dL — AB (ref 65–99)
Potassium: 4.4 mmol/L (ref 3.5–5.1)
Sodium: 136 mmol/L (ref 135–145)

## 2015-10-29 LAB — CBC
HCT: 45.1 % (ref 39.0–52.0)
HEMOGLOBIN: 15 g/dL (ref 13.0–17.0)
MCH: 29 pg (ref 26.0–34.0)
MCHC: 33.3 g/dL (ref 30.0–36.0)
MCV: 87.2 fL (ref 78.0–100.0)
PLATELETS: 291 10*3/uL (ref 150–400)
RBC: 5.17 MIL/uL (ref 4.22–5.81)
RDW: 12.7 % (ref 11.5–15.5)
WBC: 7.9 10*3/uL (ref 4.0–10.5)

## 2015-10-29 LAB — I-STAT TROPONIN, ED
TROPONIN I, POC: 0.03 ng/mL (ref 0.00–0.08)
Troponin i, poc: 0.03 ng/mL (ref 0.00–0.08)

## 2015-10-29 MED ORDER — SULFAMETHOXAZOLE-TRIMETHOPRIM 400-80 MG PO TABS: 1.0000 | ORAL_TABLET | Freq: Two times a day (BID) | ORAL | Status: DC

## 2015-10-29 NOTE — ED Notes (Signed)
Patient here with chest tightness and arm weakness/heaviness intermittently x 4 days. Indigestion feeling for same.

## 2015-10-29 NOTE — ED Provider Notes (Signed)
CSN: [id number]     Arrival date & time 10/29/15  1424 History   First MD Initiated Contact with Patient 10/29/15 [phone]     Chief Complaint  Patient presents with  . chest tightness      (Consider location/radiation/quality/duration/timing/severity/associated sxs/prior Treatment) HPI   Patient is a 65 year old male with a history of MI, HTN, CAD, GERD who presents the ED with chest tightness for 4 days. He states it is intermittent and happens roughly once a day and lasts no longer than 10 seconds. He states this feels as if his pictorial muscles are tightening. He states the tightness feels superficial not deep. No associated symptoms. She also states this may be related to his indigestion. Patient ate fried oysters roughly week ago and has had intermittent indigestion since then. Patient takes Protonix daily. He has history of GERD. Patient also complains of intermittent muscle tightness/fatigue in his arms for roughly 2 years. He has not seen a GI specialist for this. Patient denies chest pain, shortness of breath, headache, blurred vision, weakness, nausea, vomiting, diarrhea. Pt states he believes he has a spider bite on his upper right arm.   Past Medical History  Diagnosis Date  . Myocardial infarct (HCC)      MYOCARDIAL INFARCTION, INFERIOR WALL, INITIAL EPISODE (ICD-410.41)RX OVERLAPPING  DRUG-ELUDTING STENTS RCA WITH RESIDUAL DISEASE LAD & CFX. EF INITIALLY 40% NOW 60-65%  . Hyperlipidemia   . HTN (hypertension)   . CAD (coronary artery disease)   . Anxiety   . GERD (gastroesophageal reflux disease)    Past Surgical History  Procedure Laterality Date  . Cardiac catheterization    . Coronary angioplasty     Family History  Problem Relation Age of Onset  . Other      negative FH of diabetes, hypertension or coronary artery disease  . Heart disease Mother   . Diabetes Father    Social History  Substance Use Topics  . Smoking status: Current Every Day Smoker  . Smokeless  tobacco: None  . Alcohol Use: No    Review of Systems  Constitutional: Negative for fever and chills.  HENT: Negative for trouble swallowing.   Eyes: Negative for visual disturbance.  Respiratory: Positive for chest tightness. Negative for cough and shortness of breath.   Cardiovascular: Negative for chest pain and leg swelling.  Gastrointestinal: Negative for nausea, vomiting, abdominal pain, diarrhea and blood in stool.  Genitourinary: Negative for dysuria and hematuria.  Musculoskeletal: Negative for joint swelling, neck pain and neck stiffness.  Skin: Negative for rash.       lesion  Neurological: Negative for dizziness, syncope, speech difficulty, weakness and headaches.  Psychiatric/Behavioral: Negative for confusion.      Allergies  Carvedilol and Hydrochlorothiazide  Home Medications   Prior to Admission medications   Medication Sig Start Date End Date Taking? Authorizing Provider  aspirin EC 81 MG tablet Take 81 mg by mouth daily.   Yes Historical Provider, MD  atorvastatin (LIPITOR) 20 MG tablet TAKE 1 TABLET BY MOUTH EVERY EVENING. Patient taking differently: TAKE 1 TABLET BY MOUTH DAILY AT BEDTIME 04/12/15  Yes [name], MD  carvedilol (COREG) 12.5 MG tablet Take 1 tablet (12.5 mg total) by mouth 2 (two) times daily with a meal. 04/26/15  Yes [name], MD  clopidogrel (PLAVIX) 75 MG tablet TAKE 1 TABLET EVERY DAY 05/01/15  Yes [name], MD  lisinopril (PRINIVIL,ZESTRIL) 40 MG tablet Take 1 tablet (40 mg total) by mouth daily. 07/18/15  Yes Legrand Como  Burt Knack, MD  pantoprazole (PROTONIX) 40 MG tablet TAKE 1 TABLET BY MOUTH EVERY DAY Patient taking differently: TAKE 1 TABLET BY MOUTH DAILY AT BEDTIME 10/16/15  Yes [name], MD  cephALEXin (KEFLEX) 500 MG capsule Take 500 mg by mouth 3 (three) times daily. 7 day course filled 10/29/15    Historical Provider, MD  HYDROcodone-acetaminophen (NORCO) 5-325 MG tablet Take 1 tablet by mouth every 6 (six) hours as  needed for moderate pain. Patient not taking: Reported on 10/29/2015 05/20/15   Melony Overly, MD  nitroGLYCERIN (NITROSTAT) 0.4 MG SL tablet Place 1 tablet (0.4 mg total) under the tongue every 5 (five) minutes as needed for chest pain. Patient not taking: Reported on 10/29/2015 03/24/14   [name], MD  sulfamethoxazole-trimethoprim (BACTRIM) 400-80 MG tablet Take 1 tablet by mouth 2 (two) times daily. 10/29/15   Yue Glasheen L Joshua Zeringue, PA   BP 171/89 mmHg  Pulse 71  Temp(Src) 98.4 F (36.9 C) (Oral)  Resp 18  Ht 5\' 4"  (1.626 m)  Wt 74.299 kg  BMI 28.10 kg/m2  SpO2 96% Physical Exam  Constitutional: He appears well-developed and well-nourished. No distress.  HENT:  Head: Normocephalic and atraumatic.  Eyes: Conjunctivae are normal.  Neck: Normal range of motion. Neck supple.  Cardiovascular: Normal rate, regular rhythm and normal heart sounds.  Exam reveals no gallop and no friction rub.   No murmur heard. Pulmonary/Chest: Effort normal and breath sounds normal. No respiratory distress. He has no wheezes. He has no rales. He exhibits no tenderness.  No chest wall tenderness  Abdominal: Soft. Bowel sounds are normal. He exhibits no distension. There is no tenderness. There is no rebound and no guarding.  Musculoskeletal: Normal range of motion. He exhibits no edema or tenderness.  Neurological: He is alert. Coordination normal.  Skin: Skin is warm and dry.  1.5cm diameter erythematous papule with areas of scaling, surrounding erythema on upper right arm  Psychiatric: He has a normal mood and affect. His behavior is normal.    ED Course  Procedures (including critical care time) Labs Review Labs Reviewed  BASIC METABOLIC PANEL - Abnormal; Notable for the following:    Glucose, Bld 109 (*)    All other components within normal limits  CBC  I-STAT TROPOININ, ED  Randolm Idol, ED    Imaging Review Dg Chest 2 View  10/29/2015  CLINICAL DATA:  Chest tightness and upper extremity  weakness for 4 days EXAM: CHEST  2 VIEW COMPARISON:  Sep 28, 2014 FINDINGS: Lungs are clear. Heart size and pulmonary vascularity are normal. No adenopathy. No pneumothorax. No bone lesions. IMPRESSION: No edema or consolidation. Electronically Signed   By: Lowella Grip III M.D.   On: 10/29/2015 15:19   I have personally reviewed and evaluated these images and lab results as part of my medical decision-making.   EKG Interpretation None      MDM   Final diagnoses:  Chest tightness  Skin lesion of right arm    Patient is to be discharged with recommendation to follow up with PCP in regards to today's hospital visit. Chest pain is not likely of cardiac or pulmonary etiology d/t presentation, VSS, no tracheal deviation, no JVD or new murmur, RRR, breath sounds equal bilaterally, EKG without acute abnormalities, negative troponin, and negative CXR. Pt has been advised to return to the ED is CP becomes exertional, associated with diaphoresis or nausea, radiates to left jaw/arm, worsens or becomes concerning in any way. Pt appears reliable for  follow up and is agreeable to discharge. Instructed patient to be seen by his PCP tomorrow and continue to take his Protonix.  Patient given Bactrim for lesion of his right upper arm. Likely infected secondary to insect bite. Instructed the patient to discontinue Bactrim if he notices a rash. Patient expressed understanding of the discharge instructions.  Case has been discussed with and seen by Dr. Jeneen Rinks who agrees with the above plan to discharge.      Kalman Drape, [place] 10/29/15 [place], MD 11/07/15 [phone]

## 2015-10-29 NOTE — Discharge Instructions (Signed)
Follow-up with your primary care provider tomorrow regarding your visit to the emergency department today.  Take the Bactrim as prescribed. Discontinue use of Bactrim if you experience a rash. If your skin lesion on your upper right arm gets larger, becomes painful, with increased redness and discharge return to emergency department or your primary care provider to have it re-evaluated.  Return to emergency department if you experience chest pain, shortness breath, sweating, nausea, vomiting, arm pain or pain radiating into your neck or back.  Nonspecific Chest Pain  Chest pain can be caused by many different conditions. There is always a chance that your pain could be related to something serious, such as a heart attack or a blood clot in your lungs. Chest pain can also be caused by conditions that are not life-threatening. If you have chest pain, it is very important to follow up with your health care provider. CAUSES  Chest pain can be caused by:  Heartburn.  Pneumonia or bronchitis.  Anxiety or stress.  Inflammation around your heart (pericarditis) or lung (pleuritis or pleurisy).  A blood clot in your lung.  A collapsed lung (pneumothorax). It can develop suddenly on its own (spontaneous pneumothorax) or from trauma to the chest.  Shingles infection (varicella-zoster virus).  Heart attack.  Damage to the bones, muscles, and cartilage that make up your chest wall. This can include:  Bruised bones due to injury.  Strained muscles or cartilage due to frequent or repeated coughing or overwork.  Fracture to one or more ribs.  Sore cartilage due to inflammation (costochondritis). RISK FACTORS  Risk factors for chest pain may include:  Activities that increase your risk for trauma or injury to your chest.  Respiratory infections or conditions that cause frequent coughing.  Medical conditions or overeating that can cause heartburn.  Heart disease or family history of heart  disease.  Conditions or health behaviors that increase your risk of developing a blood clot.  Having had chicken pox (varicella zoster). SIGNS AND SYMPTOMS Chest pain can feel like:  Burning or tingling on the surface of your chest or deep in your chest.  Crushing, pressure, aching, or squeezing pain.  Dull or sharp pain that is worse when you move, cough, or take a deep breath.  Pain that is also felt in your back, neck, shoulder, or arm, or pain that spreads to any of these areas. Your chest pain may come and go, or it may stay constant. DIAGNOSIS Lab tests or other studies may be needed to find the cause of your pain. Your health care provider may have you take a test called an ambulatory ECG (electrocardiogram). An ECG records your heartbeat patterns at the time the test is performed. You may also have other tests, such as:  Transthoracic echocardiogram (TTE). During echocardiography, sound waves are used to create a picture of all of the heart structures and to look at how blood flows through your heart.  Transesophageal echocardiogram (TEE).This is a more advanced imaging test that obtains images from inside your body. It allows your health care provider to see your heart in finer detail.  Cardiac monitoring. This allows your health care provider to monitor your heart rate and rhythm in real time.  Holter monitor. This is a portable device that records your heartbeat and can help to diagnose abnormal heartbeats. It allows your health care provider to track your heart activity for several days, if needed.  Stress tests. These can be done through exercise or by taking  medicine that makes your heart beat more quickly.  Blood tests.  Imaging tests. TREATMENT  Your treatment depends on what is causing your chest pain. Treatment may include:  Medicines. These may include:  Acid blockers for heartburn.  Anti-inflammatory medicine.  Pain medicine for inflammatory  conditions.  Antibiotic medicine, if an infection is present.  Medicines to dissolve blood clots.  Medicines to treat coronary artery disease.  Supportive care for conditions that do not require medicines. This may include:  Resting.  Applying heat or cold packs to injured areas.  Limiting activities until pain decreases. HOME CARE INSTRUCTIONS  If you were prescribed an antibiotic medicine, finish it all even if you start to feel better.  Avoid any activities that bring on chest pain.  Do not use any tobacco products, including cigarettes, chewing tobacco, or electronic cigarettes. If you need help quitting, ask your health care provider.  Do not drink alcohol.  Take medicines only as directed by your health care provider.  Keep all follow-up visits as directed by your health care provider. This is important. This includes any further testing if your chest pain does not go away.  If heartburn is the cause for your chest pain, you may be told to keep your head raised (elevated) while sleeping. This reduces the chance that acid will go from your stomach into your esophagus.  Make lifestyle changes as directed by your health care provider. These may include:  Getting regular exercise. Ask your health care provider to suggest some activities that are safe for you.  Eating a heart-healthy diet. A registered dietitian can help you to learn healthy eating options.  Maintaining a healthy weight.  Managing diabetes, if necessary.  Reducing stress. SEEK MEDICAL CARE IF:  Your chest pain does not go away after treatment.  You have a rash with blisters on your chest.  You have a fever. SEEK IMMEDIATE MEDICAL CARE IF:   Your chest pain is worse.  You have an increasing cough, or you cough up blood.  You have severe abdominal pain.  You have severe weakness.  You faint.  You have chills.  You have sudden, unexplained chest discomfort.  You have sudden, unexplained  discomfort in your arms, back, neck, or jaw.  You have shortness of breath at any time.  You suddenly start to sweat, or your skin gets clammy.  You feel nauseous or you vomit.  You suddenly feel light-headed or dizzy.  Your heart begins to beat quickly, or it feels like it is skipping beats. These symptoms may represent a serious problem that is an emergency. Do not wait to see if the symptoms will go away. Get medical help right away. Call your local emergency services (911 in the [place] Do not drive yourself to the hospital.   This information is not intended to replace advice given to you by your health care provider. Make sure you discuss any questions you have with your health care provider.   Document Released: 02/20/2005 Document Revised: 06/03/2014 Document Reviewed: 12/17/2013 Elsevier Interactive Patient Education [employer].

## 2015-11-09 IMAGING — US US ART/VEN ABD/PELV/SCROTUM DOPPLER LTD
1 series · 14 of 25 positions shown · non-contrast
Comparison: None.

CLINICAL DATA: Scrotal pain.

EXAM:
SCROTAL ULTRASOUND
DOPPLER ULTRASOUND OF THE TESTICLES
TECHNIQUE: Complete ultrasound examination of the testicles, epididymis, and
other scrotal structures was performed. Color and spectral Doppler
ultrasound were also utilized to evaluate blood flow to the
testicles.

[Series 1: us art/ven abd/pelv/scrotum doppler ltd · 0.07mm/px · 14 of 27 slices shown]
[im 1/27]
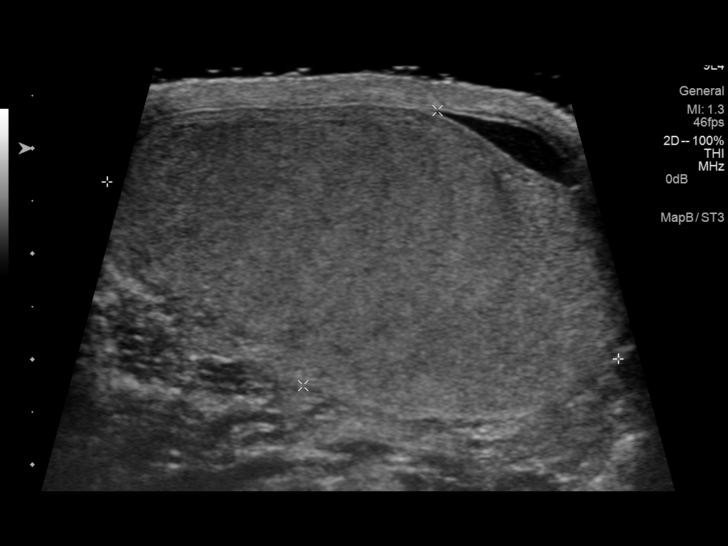
[im 3/27]
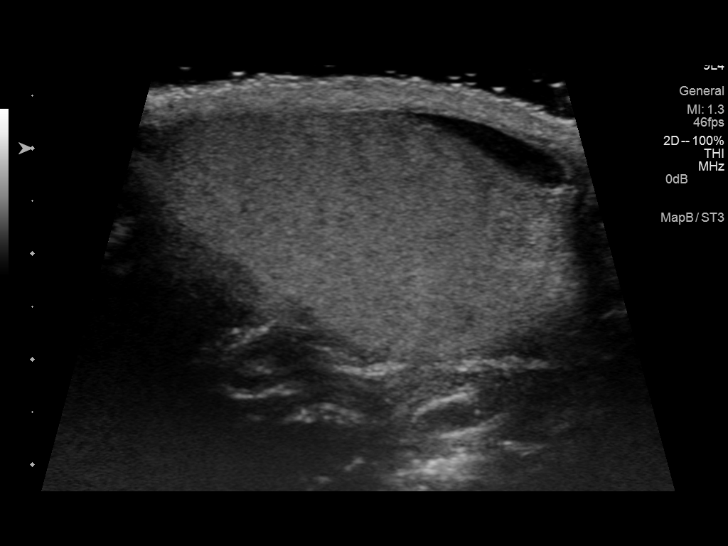
[im 5/27]
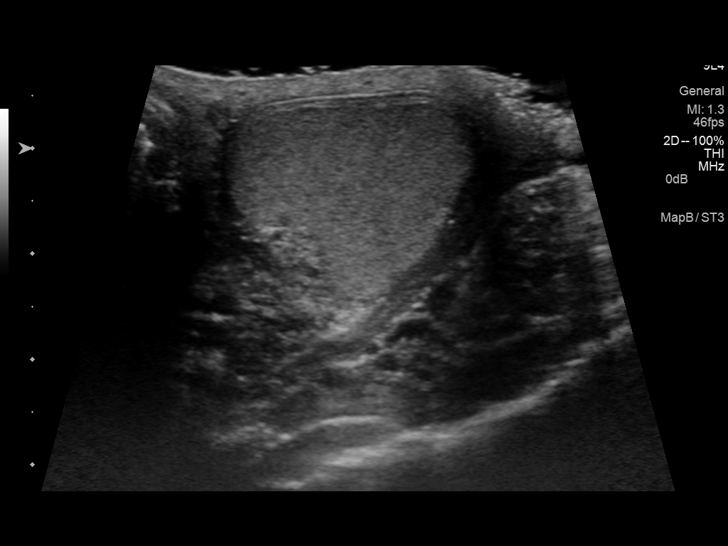
[im 7/27]
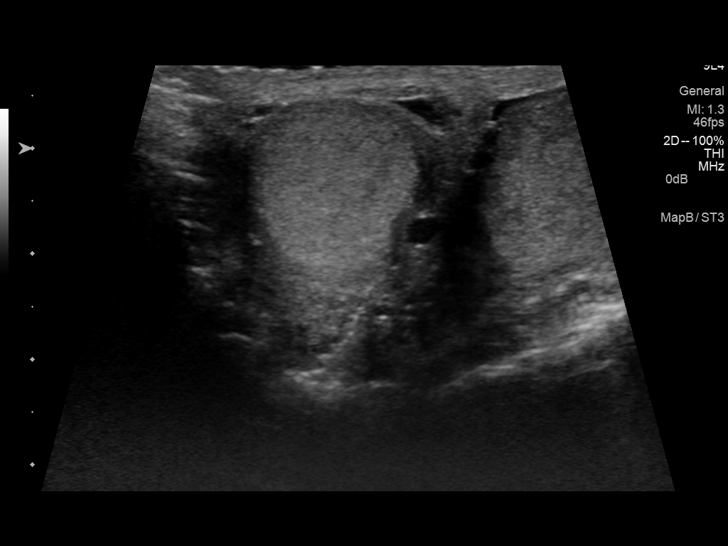
[im 9/27]
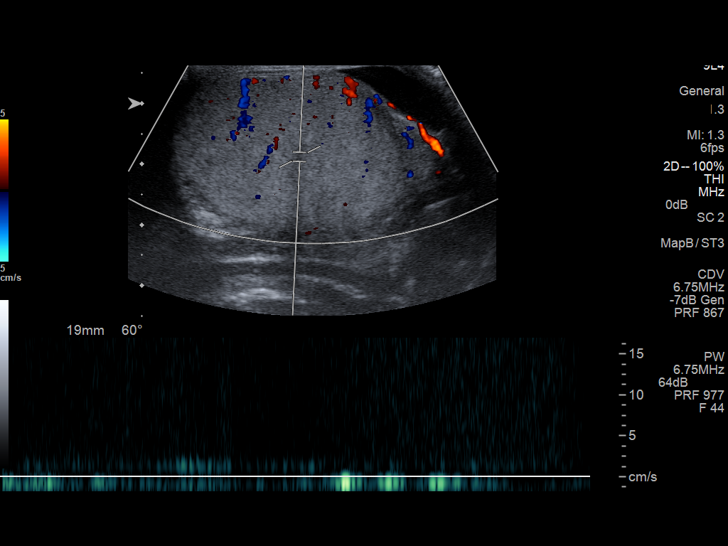
[im 10/27]
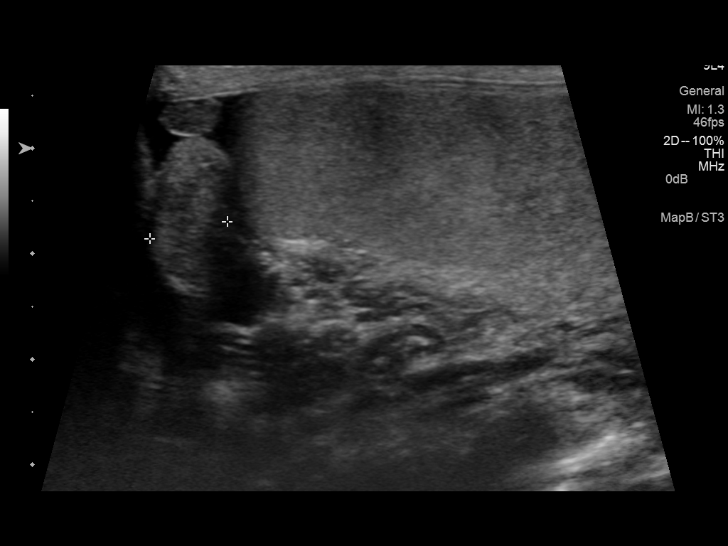
[im 12/27]
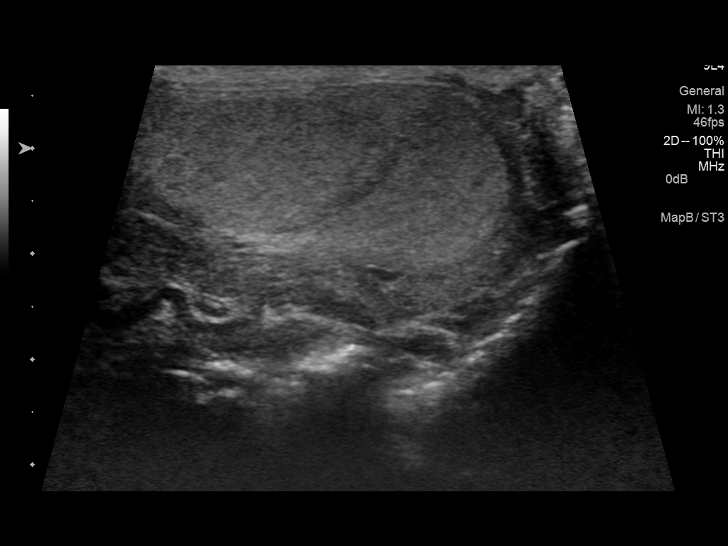
[im 15/27]
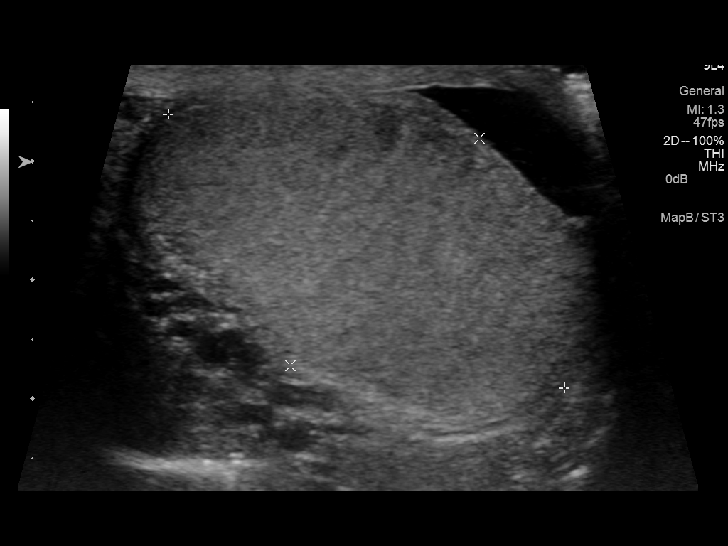
[im 17/27]
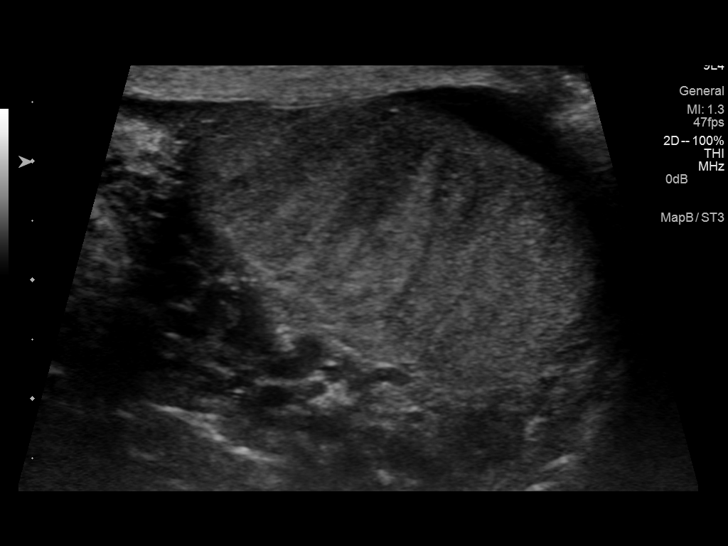
[im 18/27]
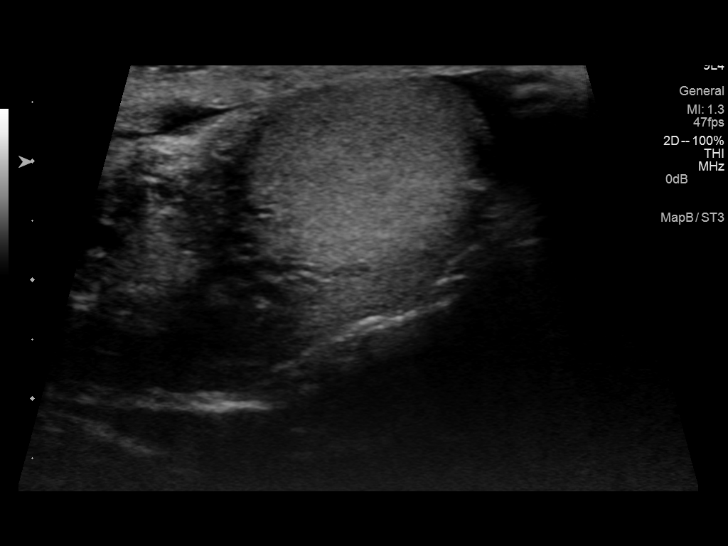
[im 20/27]
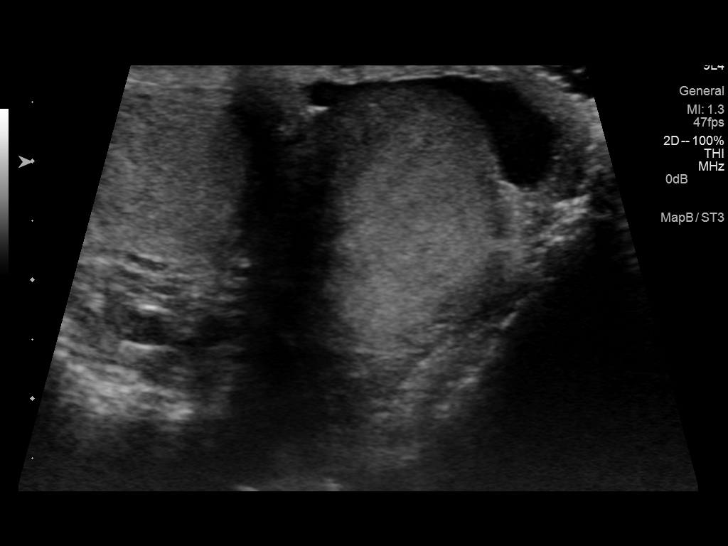
[im 22/27]
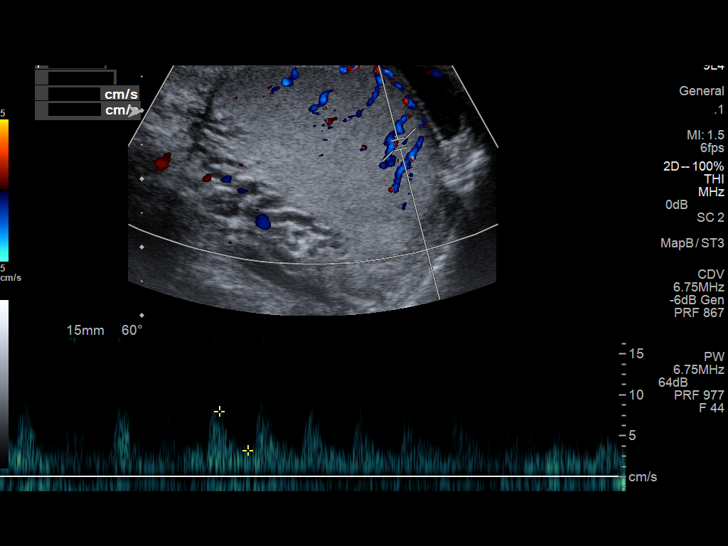
[im 24/27]
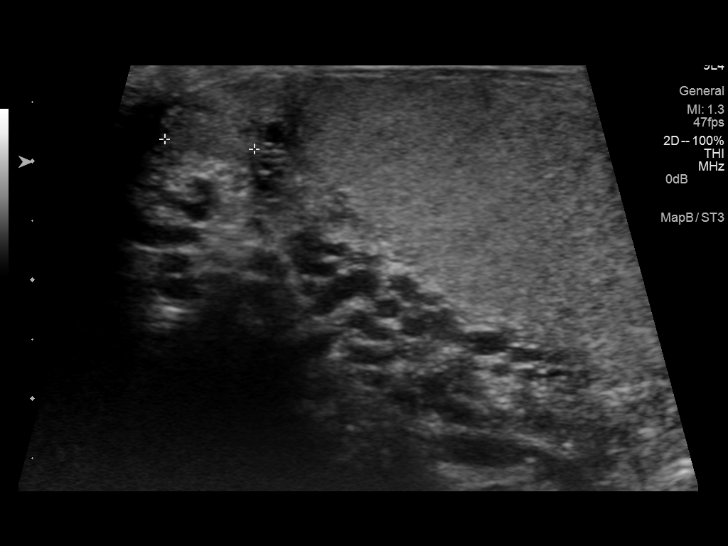
[im 27/27]
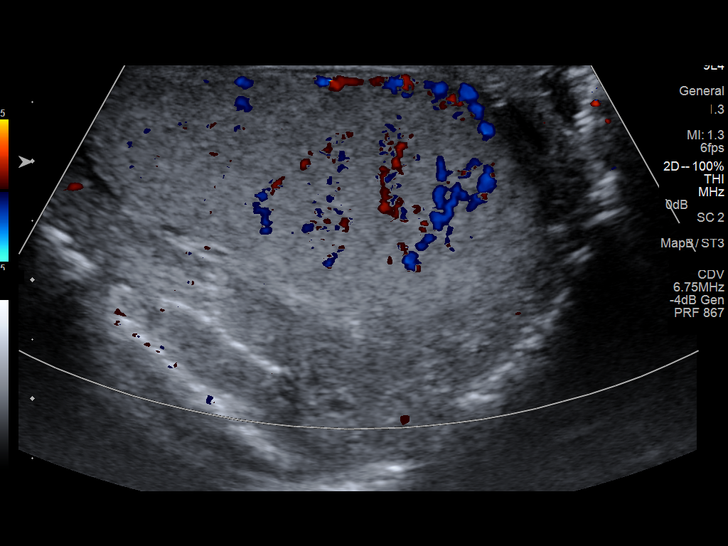

[14 of 25 positions shown; findings below may reference images not displayed]

FINDINGS: Right testicle

Measurements: 5.1 x 2.9 x 2.9 cm. No mass or microlithiasis
visualized.

Left testicle

Measurements: 4.1 x 2.5 x 2.7 cm. No mass or microlithiasis
visualized.

Right epididymis:  Normal in size and appearance.

Left epididymis:  Normal in size and appearance.

Hydrocele:  None visualized.

Varicocele:  None visualized.

Pulsed Doppler interrogation of both testes demonstrates normal low
resistance arterial and venous waveforms bilaterally.
IMPRESSION: No testicular mass or torsion. No abnormal scrotal fluid
collections.

## 2016-02-08 ENCOUNTER — Other Ambulatory Visit: Payer: Self-pay | Admitting: Cardiovascular Disease

## 2016-03-26 ENCOUNTER — Other Ambulatory Visit: Payer: Self-pay | Admitting: Cardiovascular Disease

## 2016-03-27 ENCOUNTER — Other Ambulatory Visit: Payer: Self-pay | Admitting: Cardiovascular Disease

## 2016-04-24 ENCOUNTER — Other Ambulatory Visit: Payer: Self-pay | Admitting: Cardiovascular Disease

## 2016-05-16 DIAGNOSIS — R197 Diarrhea, unspecified: Secondary | ICD-10-CM | POA: Diagnosis not present

## 2016-07-26 ENCOUNTER — Other Ambulatory Visit: Payer: Self-pay | Admitting: Cardiovascular Disease

## 2016-07-31 ENCOUNTER — Other Ambulatory Visit: Payer: Self-pay | Admitting: Cardiovascular Disease

## 2016-08-15 ENCOUNTER — Encounter (INDEPENDENT_AMBULATORY_CARE_PROVIDER_SITE_OTHER): Payer: Self-pay

## 2016-08-15 ENCOUNTER — Telehealth: Payer: Self-pay | Admitting: *Deleted

## 2016-08-15 ENCOUNTER — Ambulatory Visit (INDEPENDENT_AMBULATORY_CARE_PROVIDER_SITE_OTHER): Payer: Medicare Other | Admitting: Cardiovascular Disease

## 2016-08-15 ENCOUNTER — Encounter: Payer: Self-pay | Admitting: Cardiovascular Disease

## 2016-08-15 VITALS — BP 190/110 | HR 70 | Ht 64.0 in | Wt 168.2 lb

## 2016-08-15 DIAGNOSIS — E784 Other hyperlipidemia: Secondary | ICD-10-CM

## 2016-08-15 DIAGNOSIS — E7849 Other hyperlipidemia: Secondary | ICD-10-CM

## 2016-08-15 DIAGNOSIS — I2510 Atherosclerotic heart disease of native coronary artery without angina pectoris: Secondary | ICD-10-CM | POA: Diagnosis not present

## 2016-08-15 DIAGNOSIS — I10 Essential (primary) hypertension: Secondary | ICD-10-CM | POA: Diagnosis not present

## 2016-08-15 MED ORDER — AMLODIPINE BESYLATE 5 MG PO TABS: 5.0000 mg | ORAL_TABLET | Freq: Every day | ORAL | 11 refills | Status: DC

## 2016-08-15 NOTE — Telephone Encounter (Signed)
Fasting blood work and appt with hypertension clinic was to be done in 4 weeks.  I spoke with pt and scheduled these appointments for April 25,2018 at 8:15 and 8:30.  Pt aware to be fasting.

## 2016-08-15 NOTE — Patient Instructions (Addendum)
Medication Instructions:  Your physician has recommended you make the following change in your medication:  Start amlodipine 5 mg by mouth daily.    Labwork: Your physician recommends that you return for lab work in: 4 weeks on day of appointment in hypertension clinic.  This will be fasting.  --CMET and lipid profiles   Testing/Procedures: none  Follow-Up: Your physician recommends that you schedule a follow-up appointment in: about 4 months with hypertension clinic.  Your physician wants you to follow-up in: 12 months with Dr. Emelda Fear will receive a reminder letter in the mail two months in advance. If you don't receive a letter, please call our office to schedule the follow-up appointment.   Check blood pressure 3 days per week and bring this readings to appointment in hypertension clinic     If you need a refill on your cardiac medications before your next appointment, please call your pharmacy.

## 2016-08-15 NOTE — Progress Notes (Signed)
Cardiology Office Note Date:  08/15/2016   ID:  Alexander [name], DOB 1950-11-25, MRN [id number]  PCP:  [name], MD  Cardiologist:  [name], MD    Chief Complaint  Patient presents with  . Follow-up     History of Present Illness: Alexander [name] is a 66 y.o. male who presents for follow-up of coronary artery disease, hypertension, and hyperlipidemia. Patient initially presented in 2008 with an acute inferior wall MI, treated with primary PCI using overlapping Taxus drug-eluting stents the right coronary artery. He has been maintained on long-term dual antiplatelet therapy with aspirin and Plavix. Patient has long-standing whitecoat hypertension.  He was seen in the ED in June 2017 with chest pain that he thought was 'indigesion and gas.' He had eaten food he normally doesn't eat. His wife was concerned and they went to the ED for evaluation. Testing showed no cardiac problems and symptoms have not recurred.   Here alone today. No complaints. Today, he denies symptoms of palpitations, chest pain, shortness of breath, orthopnea, PND, lower extremity edema, dizziness, or syncope. He laid 100 bags of mulch recently without exertional symptoms.   Past Medical History:  Diagnosis Date  . Anxiety   . CAD (coronary artery disease)   . GERD (gastroesophageal reflux disease)   . HTN (hypertension)   . Hyperlipidemia   . Myocardial infarct     MYOCARDIAL INFARCTION, INFERIOR WALL, INITIAL EPISODE (ICD-410.41)RX OVERLAPPING  DRUG-ELUDTING STENTS RCA WITH RESIDUAL DISEASE LAD & CFX. EF INITIALLY 40% NOW 60-65%    Past Surgical History:  Procedure Laterality Date  . CARDIAC CATHETERIZATION    . CORONARY ANGIOPLASTY      Current Outpatient Prescriptions  Medication Sig Dispense Refill  . aspirin EC 81 MG tablet Take 81 mg by mouth daily.    Marland Kitchen atorvastatin (LIPITOR) 20 MG tablet TAKE 1 TABLET BY MOUTH EVERY EVENING. 90 tablet 0  . carvedilol (COREG) 12.5 MG tablet TAKE 1  TABLET (12.5 MG TOTAL) BY MOUTH 2 (TWO) TIMES DAILY WITH A MEAL. 180 tablet 0  . clopidogrel (PLAVIX) 75 MG tablet TAKE 1 TABLET BY MOUTH EVERY DAY 90 tablet 0  . lisinopril (PRINIVIL,ZESTRIL) 40 MG tablet TAKE 1 TABLET BY MOUTH EVERY DAY MUST KEEP MARCH APPT FOR REFILLS 30 tablet 0  . nitroGLYCERIN (NITROSTAT) 0.4 MG SL tablet Place 1 tablet (0.4 mg total) under the tongue every 5 (five) minutes as needed for chest pain. 25 tablet 2  . pantoprazole (PROTONIX) 40 MG tablet TAKE 1 TABLET BY MOUTH EVERY DAY 30 tablet 0  . amLODipine (NORVASC) 5 MG tablet Take 1 tablet (5 mg total) by mouth daily. 30 tablet 11   No current facility-administered medications for this visit.     Allergies:   Carvedilol and Hydrochlorothiazide   Social History:  The patient  reports that he has been smoking.  He has never used smokeless tobacco. He reports that he does not drink alcohol or use drugs.   Family History:  The patient's  family history includes Diabetes in his father; Heart disease in his mother.    ROS:  Please see the history of present illness.  Otherwise, review of systems is positive for easy bruising.  All other systems are reviewed and negative.    PHYSICAL EXAM: VS:  BP (!) 190/110 (BP Location: Left Arm, Patient Position: Sitting) Comment: RIGHT arm =192/100  Pulse 70   Ht '5\' 4"'$  (1.626 m)   Wt 168 lb 3.2 oz (76.3 kg)  BMI 28.87 kg/m  , BMI Body mass index is 28.87 kg/m. GEN: Well nourished, well developed, in no acute distress  HEENT: normal  Neck: no JVD, no masses. No carotid bruits Cardiac: RRR without murmur or gallop                Respiratory:  clear to auscultation bilaterally, normal work of breathing GI: soft, nontender, nondistended, + BS MS: no deformity or atrophy  Ext: no pretibial edema, pedal pulses 2+= bilaterally Skin: warm and dry, no rash Neuro:  Strength and sensation are intact Psych: euthymic mood, full affect  EKG:  EKG is ordered today. The ekg ordered  today shows NSR 71 bpm, within normal limits  Recent Labs: 10/29/2015: BUN 9; Creatinine, Ser 0.88; Hemoglobin 15.0; Platelets 291; Potassium 4.4; Sodium 136   Lipid Panel     Component Value Date/Time   CHOL 144 06/07/2014 0741   TRIG 276.0 (H) 06/07/2014 0741   HDL 31.10 (L) 06/07/2014 0741   CHOLHDL 5 06/07/2014 0741   VLDL 55.2 (H) 06/07/2014 0741   LDLCALC 53 04/28/2012 0936   LDLDIRECT 64.7 06/07/2014 0741      Wt Readings from Last 3 Encounters:  08/15/16 168 lb 3.2 oz (76.3 kg)  10/29/15 163 lb 12.8 oz (74.3 kg)  04/10/15 169 lb (76.7 kg)    ASSESSMENT AND PLAN: 1.  HTN, uncontrolled: long hx of white coat HTN but BP higher than normal today. Hasn't been monitoring at home. Unable to tolerate increase in antihypertensives in past. Recommend:  Add amlodipine 5 mg daily, continue carvedilol and lisinopril at current doses  FU HTN Clinic 4 weeks  Record BP's on home cuff 3 days per week and call in with readings in 2 weeks. Bring readings to HTN Clinic visit  Check BMET on return  Pt has had 3 cups of coffee and smoked cigarettes prior to his appt today  2. Hyperlipidemia: continue atorvastatin, check lipids when he returns  3. CAD, native vessel, without angina: works at a high workload without symptoms. Continue current Rx with changes as outlined above. Cath findings from 2008 reviewed when he presented with an inferior infarct, treated with a Taxus DES in the RCA (overlapping), and had moderate disease in the LAD and LCx  4. Tobacco abuse: not interested in quitting.   Current medicines are reviewed with the patient today.  The patient does not have concerns regarding medicines.  Labs/ tests ordered today include:   Orders Placed This Encounter  Procedures  . Comp Met (CMET)  . Lipid Profile  . EKG 12-Lead    Disposition:   FU one month HTN Clinic and lab work. One year with me  Signed, [name], MD  08/15/2016 11:33 AM    [address], [place], [place]  [address] Phone: [phone]; Fax: [phone]

## 2016-08-17 ENCOUNTER — Other Ambulatory Visit: Payer: Self-pay | Admitting: Cardiovascular Disease

## 2016-08-26 ENCOUNTER — Other Ambulatory Visit: Payer: Self-pay | Admitting: Cardiovascular Disease

## 2016-09-05 ENCOUNTER — Telehealth: Payer: Self-pay | Admitting: Cardiovascular Disease

## 2016-09-05 NOTE — Telephone Encounter (Signed)
I called Dr Jethro Bolus office number and got a voicemail.  I left a message that plavix does not have to be held for routine dental cleaning.  If the pt needs additional dental work such as extractions then a clearance request needs to be faxed to our office at [phone].

## 2016-09-05 NOTE — Telephone Encounter (Signed)
New Message     1. What dental office are you calling from? Dr Molli Hazard  2. What is your office phone and fax number?  Fax [phone]  Office [phone]  3. What type of procedure is the patient having performed? Dental check up   4. What date is procedure scheduled? today  5. What is your question (ex. Antibiotics prior to procedure, holding medication-we need to know how long dentist wants pt to hold med)?  Plavix  , please advise

## 2016-09-10 ENCOUNTER — Other Ambulatory Visit: Payer: Self-pay | Admitting: Cardiovascular Disease

## 2016-09-18 ENCOUNTER — Ambulatory Visit (INDEPENDENT_AMBULATORY_CARE_PROVIDER_SITE_OTHER): Payer: Medicare Other | Admitting: Pharmacist

## 2016-09-18 ENCOUNTER — Other Ambulatory Visit: Payer: Medicare Other | Admitting: *Deleted

## 2016-09-18 VITALS — BP 134/82 | HR 68

## 2016-09-18 DIAGNOSIS — E7849 Other hyperlipidemia: Secondary | ICD-10-CM

## 2016-09-18 DIAGNOSIS — I2510 Atherosclerotic heart disease of native coronary artery without angina pectoris: Secondary | ICD-10-CM

## 2016-09-18 DIAGNOSIS — I10 Essential (primary) hypertension: Secondary | ICD-10-CM | POA: Diagnosis not present

## 2016-09-18 DIAGNOSIS — E784 Other hyperlipidemia: Secondary | ICD-10-CM | POA: Diagnosis not present

## 2016-09-18 LAB — LIPID PANEL
CHOLESTEROL TOTAL: 135 mg/dL (ref 100–199)
Chol/HDL Ratio: 4 ratio (ref 0.0–5.0)
HDL: 34 mg/dL — ABNORMAL LOW (ref >39)
LDL Calculated: 54 mg/dL (ref 0–99)
Triglycerides: 235 mg/dL — ABNORMAL HIGH (ref 0–149)
VLDL Cholesterol Cal: 47 mg/dL — ABNORMAL HIGH (ref 5–40)

## 2016-09-18 LAB — COMPREHENSIVE METABOLIC PANEL
A/G RATIO: 1.7 (ref 1.2–2.2)
ALBUMIN: 4.3 g/dL (ref 3.6–4.8)
ALK PHOS: 91 IU/L (ref 39–117)
ALT: 10 IU/L (ref 0–44)
AST: 7 IU/L (ref 0–40)
BILIRUBIN TOTAL: 0.4 mg/dL (ref 0.0–1.2)
BUN / CREAT RATIO: 15 (ref 10–24)
BUN: 12 mg/dL (ref 8–27)
CHLORIDE: 99 mmol/L (ref 96–106)
CO2: 25 mmol/L (ref 18–29)
Calcium: 9 mg/dL (ref 8.6–10.2)
Creatinine, Ser: 0.82 mg/dL (ref 0.76–1.27)
GFR calc Af Amer: 107 mL/min/{1.73_m2} (ref >59)
GFR calc non Af Amer: 93 mL/min/{1.73_m2} (ref >59)
GLOBULIN, TOTAL: 2.5 g/dL (ref 1.5–4.5)
Glucose: 107 mg/dL — ABNORMAL HIGH (ref 65–99)
Potassium: 4.4 mmol/L (ref 3.5–5.2)
SODIUM: 139 mmol/L (ref 134–144)
Total Protein: 6.8 g/dL (ref 6.0–8.5)

## 2016-09-18 NOTE — Progress Notes (Signed)
Patient ID: Alexander [name]                 DOB: 14-Sep-1950                      MRN: [id number]     HPI: Alexander [name] is a 66 y.o. male patient of Alexander [name] who presents today for hypertension evaluation.  PMH includes coronary artery disease (s/p PCI with durg eluting stents in 2008), hypertension, and hyperlipidemia. Of note he has long-standing history of whitecoat hypertension. At his most recent visit with Alexander [name] his pressure was markedly elevated and more so than usual. He was started on amlodipine 5mg  daily.   Today he presents today and states that he has had a long history of high blood pressure after joining the [employer]. He states that he gets stressed any time he has to check his pressure or come for a visit. He states he feels he is overmedicated because people get "excited" about his high pressures.   He reports he has not taken morning medications since he was unable to eat. He did have a cigarette on the way to the office this morning, but did not drink his morning coffee.  He denies dizziness or other side effects from addition of amlodipine.   Current HTN meds:  Amlodipine 5mg  daily  Carvedilol 12.5mg  BID  Lisinopril 40mg  daily  Previously tried: HCTZ - leg numbness, higher doses of carvedilol - leg pain  BP goal: <130/80  Family History: The patient's  family history includes Diabetes in his father; Heart disease in his mother.   Social History: Current smoker  Diet: When in [place] eat a lot of tuna/ fish. When at home usually eat from home. Does not cook with salt but does at table for potatoes and bland foods. No coffee this morning - usually has 3-6 cups of coffee per morning.   Exercise: Works around [employer] and [employer], yard work.  Home BP readings:  129-164/66-82 (mostly 140s/70s)  Wt Readings from Last 3 Encounters:  08/15/16 168 lb 3.2 oz (76.3 kg)  10/29/15 163 lb 12.8 oz (74.3 kg)  04/10/15 169 lb (76.7 kg)   BP Readings from Last 3  Encounters:  09/18/16 136/74  08/15/16 (!) 190/110  10/29/15 183/95   Pulse Readings from Last 3 Encounters:  09/18/16 68  08/15/16 70  10/29/15 68    Renal function: CrCl cannot be calculated (Patient's most recent lab result is older than the maximum 21 days allowed.).  Past Medical History:  Diagnosis Date  . Anxiety   . CAD (coronary artery disease)   . GERD (gastroesophageal reflux disease)   . HTN (hypertension)   . Hyperlipidemia   . Myocardial infarct (HCC)     MYOCARDIAL INFARCTION, INFERIOR WALL, INITIAL EPISODE (ICD-410.41)RX OVERLAPPING  DRUG-ELUDTING STENTS RCA WITH RESIDUAL DISEASE LAD & CFX. EF INITIALLY 40% NOW 60-65%    Current Outpatient Prescriptions on File Prior to Visit  Medication Sig Dispense Refill  . amLODipine (NORVASC) 5 MG tablet Take 1 tablet (5 mg total) by mouth daily. 30 tablet 11  . aspirin EC 81 MG tablet Take 81 mg by mouth daily.    Marland Kitchen atorvastatin (LIPITOR) 20 MG tablet TAKE 1 TABLET BY MOUTH EVERY EVENING. 90 tablet 3  . carvedilol (COREG) 12.5 MG tablet TAKE 1 TABLET BY MOUTH TWICE A DAY WITH A MEAL 180 tablet 3  . clopidogrel (PLAVIX) 75 MG tablet Take 1  tablet (75 mg total) by mouth daily. 90 tablet 3  . lisinopril (PRINIVIL,ZESTRIL) 40 MG tablet Take 1 tablet (40 mg total) by mouth daily. 90 tablet 3  . nitroGLYCERIN (NITROSTAT) 0.4 MG SL tablet Place 1 tablet (0.4 mg total) under the tongue every 5 (five) minutes as needed for chest pain. 25 tablet 2  . pantoprazole (PROTONIX) 40 MG tablet TAKE 1 TABLET BY MOUTH DAILY 30 tablet 6   No current facility-administered medications on file prior to visit.     Allergies  Allergen Reactions  . Carvedilol Other (See Comments)    REACTION: can only take low doses - 12.5 mg (higher doses cause leg pain)  . Hydrochlorothiazide Other (See Comments)    Leg numbness.    Blood pressure 136/74, pulse 68.   Assessment/Plan: Hypertension: BP today surprisingly borderline at goal in office. No  medication changes today. He will continue to monitor occasionally at home. Follow up with Alexander [name] as recommended and HTN clinic as needed.    Thank you, Lelan Pons. Patterson Hammersmith, [facility]  09/18/2016 8:51 AM

## 2016-09-18 NOTE — Patient Instructions (Signed)
Check your blood pressure at home daily (if able) and keep record of the readings.  Take your BP meds as follows: CONTINUE all medications as prescribed  Bring all of your meds, your BP cuff and your record of home blood pressures to your next appointment.  Exercise as you're able, try to walk approximately 30 minutes per day.  Keep salt intake to a minimum, especially watch canned and prepared boxed foods.  Eat more fresh fruits and vegetables and fewer canned items.  Avoid eating in fast food restaurants.    HOW TO TAKE YOUR BLOOD PRESSURE: . Rest 5 minutes before taking your blood pressure. .  Don't smoke or drink caffeinated beverages for at least 30 minutes before. . Take your blood pressure before (not after) you eat. . Sit comfortably with your back supported and both feet on the floor (don't cross your legs). . Elevate your arm to heart level on a table or a desk. . Use the proper sized cuff. It should fit smoothly and snugly around your bare upper arm. There should be enough room to slip a fingertip under the cuff. The bottom edge of the cuff should be 1 inch above the crease of the elbow. . Ideally, take 3 measurements at one sitting and record the average.

## 2016-10-18 ENCOUNTER — Ambulatory Visit: Payer: Medicare Other

## 2016-11-07 DIAGNOSIS — R197 Diarrhea, unspecified: Secondary | ICD-10-CM | POA: Diagnosis not present

## 2016-12-08 IMAGING — DX DG CHEST 2V
2 series · 2 of 2 positions shown · non-contrast
Comparison: September 28, 2014

CLINICAL DATA: Chest tightness and upper extremity weakness for 4
days

EXAM:
CHEST  2 VIEW

[w chest pa]
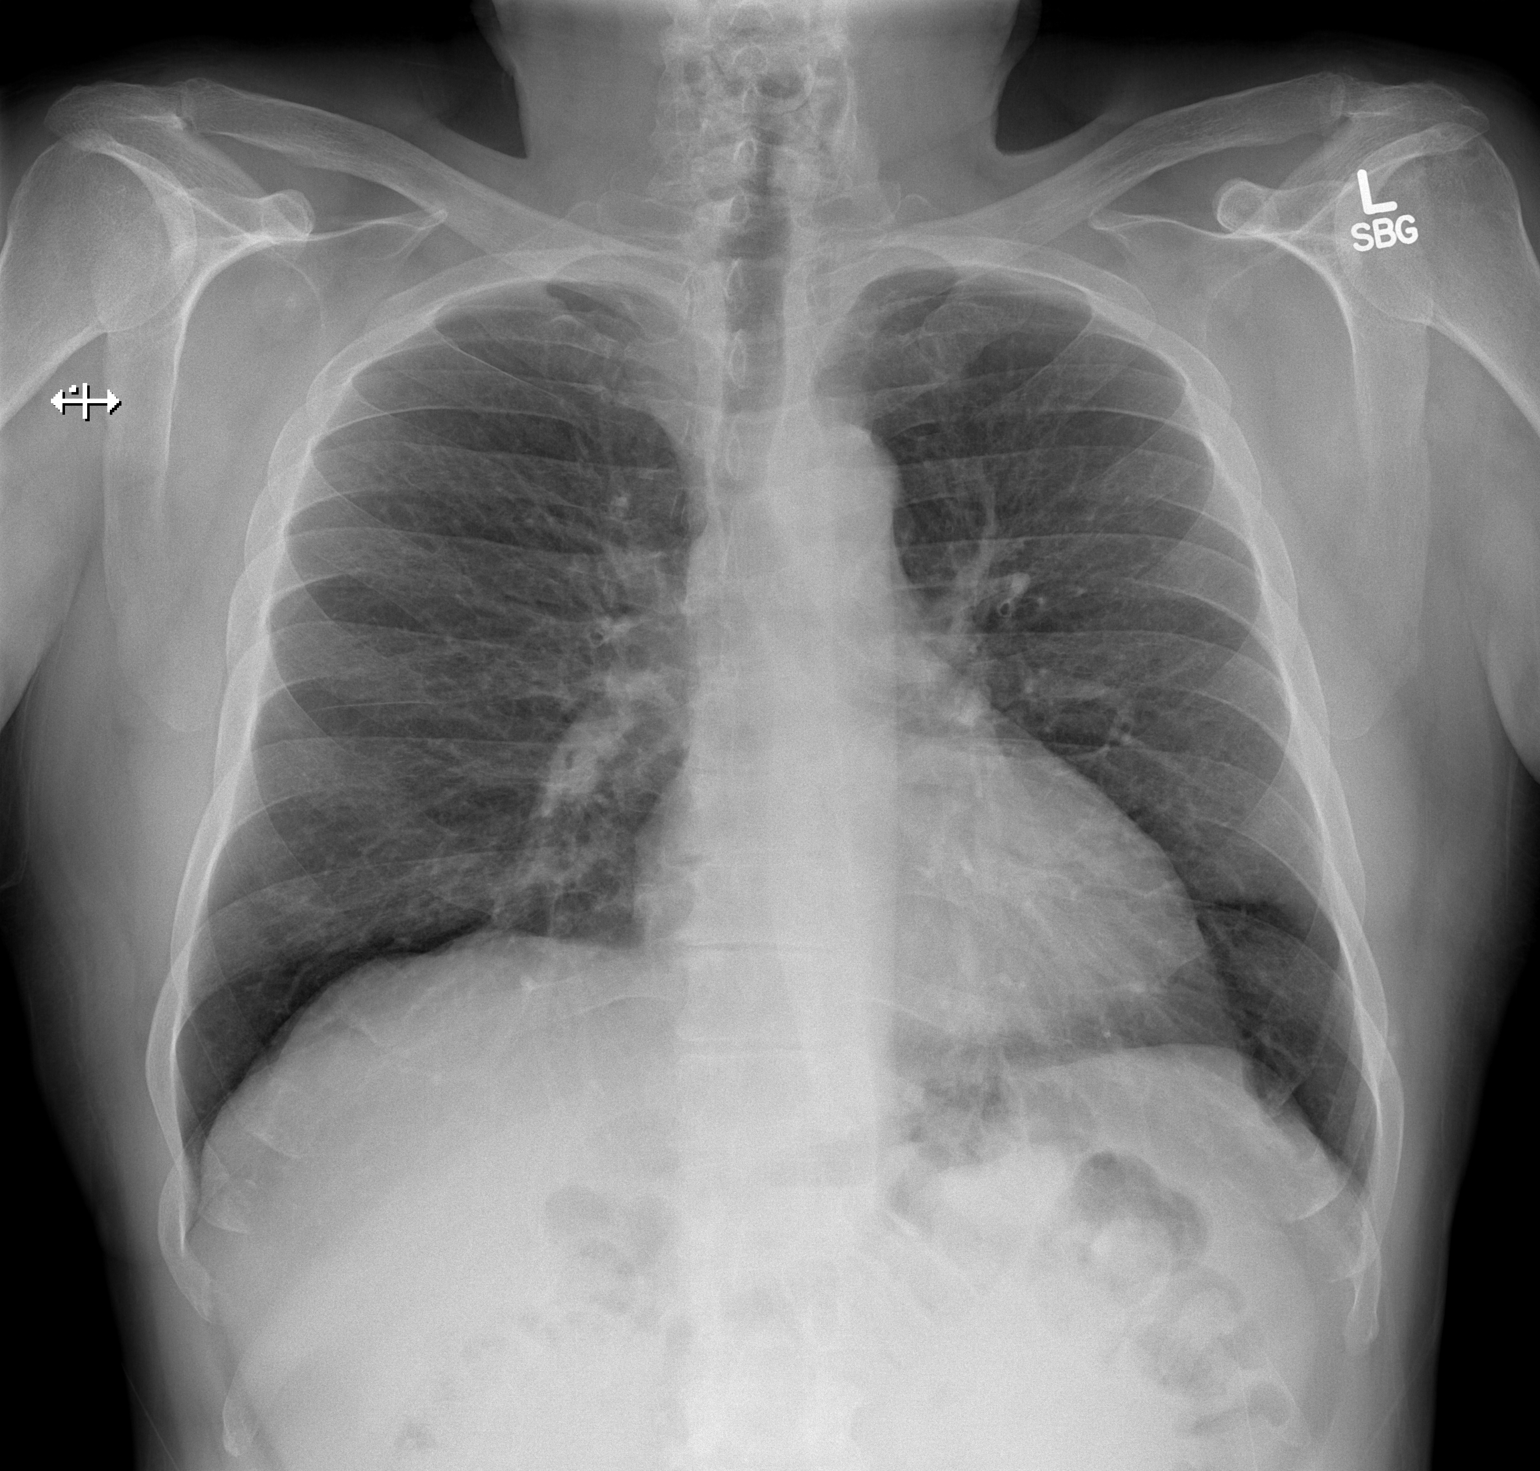

[w chest lat]
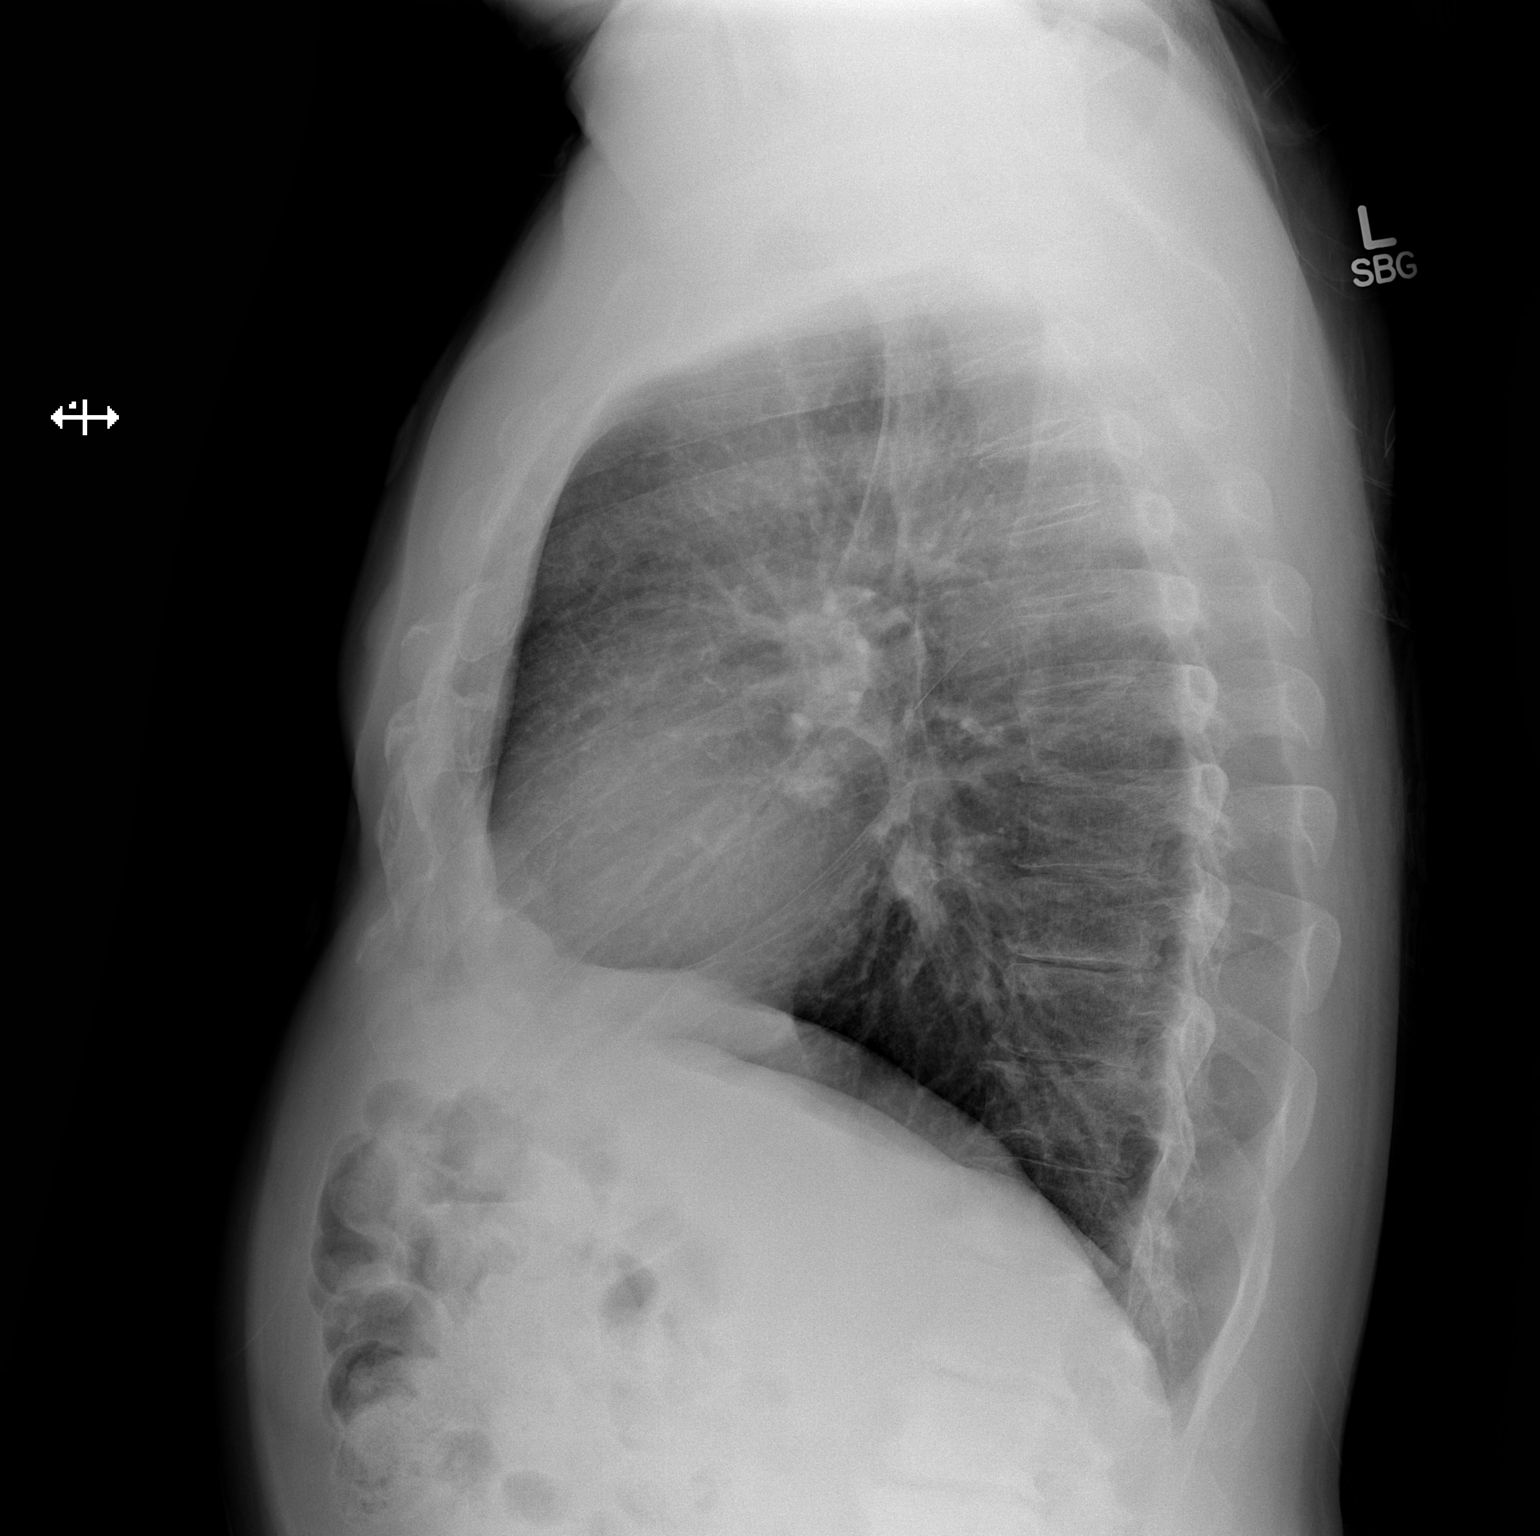

[2 of 2 positions shown; findings below may reference images not displayed]

FINDINGS: Lungs are clear. Heart size and pulmonary vascularity are normal. No
adenopathy. No pneumothorax. No bone lesions.
IMPRESSION: No edema or consolidation.

## 2016-12-17 ENCOUNTER — Ambulatory Visit: Payer: Medicare Other

## 2016-12-17 ENCOUNTER — Other Ambulatory Visit: Payer: Medicare Other

## 2017-04-03 DIAGNOSIS — C44222 Squamous cell carcinoma of skin of right ear and external auricular canal: Secondary | ICD-10-CM | POA: Diagnosis not present

## 2017-04-03 DIAGNOSIS — L218 Other seborrheic dermatitis: Secondary | ICD-10-CM | POA: Diagnosis not present

## 2017-04-08 ENCOUNTER — Other Ambulatory Visit: Payer: Self-pay | Admitting: Cardiovascular Disease

## 2017-04-15 DIAGNOSIS — C44222 Squamous cell carcinoma of skin of right ear and external auricular canal: Secondary | ICD-10-CM | POA: Diagnosis not present

## 2017-05-08 DIAGNOSIS — C44212 Basal cell carcinoma of skin of right ear and external auricular canal: Secondary | ICD-10-CM | POA: Diagnosis not present

## 2017-08-08 ENCOUNTER — Encounter: Payer: Self-pay | Admitting: Cardiovascular Disease

## 2017-08-13 ENCOUNTER — Other Ambulatory Visit: Payer: Self-pay | Admitting: Cardiovascular Disease

## 2017-08-18 ENCOUNTER — Encounter: Payer: Self-pay | Admitting: Cardiovascular Disease

## 2017-08-18 ENCOUNTER — Ambulatory Visit (INDEPENDENT_AMBULATORY_CARE_PROVIDER_SITE_OTHER): Payer: Medicare Other | Admitting: Cardiovascular Disease

## 2017-08-18 ENCOUNTER — Other Ambulatory Visit: Payer: Self-pay | Admitting: Cardiovascular Disease

## 2017-08-18 VITALS — BP 132/88 | HR 72 | Ht 64.0 in | Wt 171.0 lb

## 2017-08-18 DIAGNOSIS — I10 Essential (primary) hypertension: Secondary | ICD-10-CM

## 2017-08-18 DIAGNOSIS — I25119 Atherosclerotic heart disease of native coronary artery with unspecified angina pectoris: Secondary | ICD-10-CM

## 2017-08-18 NOTE — Progress Notes (Signed)
Cardiology Office Note Date:  08/19/2017   ID:  Alexander [name], DOB [birth year], MRN [id number]  PCP:  No primary care provider on file.  Cardiologist:  [name], MD    Chief Complaint  Patient presents with  . Chest Pain     History of Present Illness: Alexander [name] is a 67 y.o. male who presents for follow-up of CAD.   Patient initially presented in 2008 with an acute inferior wall MI, treated with primary PCI using overlapping Taxus drug-eluting stents the right coronary artery. He has been maintained on long-term dual antiplatelet therapy with aspirin and Plavix. Patient has long-standing whitecoat hypertension. He has continued to smoke cigarettes. He's been compliant with medications, but doesn't follow a diet.   The patient describes episodes of "indigestion" that are sometimes brought on after big meals.  He has typically experienced this with physical activity after a meal.  States that over the past year his symptoms have not been as severe as previous and in fact he feels like he's doing quite well.  Denies shortness of breath, edema, orthopnea, PND, or heart palpitations.  Past Medical History:  Diagnosis Date  . Anxiety   . CAD (coronary artery disease)   . GERD (gastroesophageal reflux disease)   . HTN (hypertension)   . Hyperlipidemia   . Myocardial infarct (HCC)     MYOCARDIAL INFARCTION, INFERIOR WALL, INITIAL EPISODE (ICD-410.41)RX OVERLAPPING  DRUG-ELUDTING STENTS RCA WITH RESIDUAL DISEASE LAD & CFX. EF INITIALLY 40% NOW 60-65%    Past Surgical History:  Procedure Laterality Date  . CARDIAC CATHETERIZATION    . CORONARY ANGIOPLASTY      Current Outpatient Medications  Medication Sig Dispense Refill  . amLODipine (NORVASC) 5 MG tablet TAKE 1 TABLET BY MOUTH EVERY DAY 30 tablet 0  . aspirin EC 81 MG tablet Take 81 mg by mouth daily.    Marland Kitchen atorvastatin (LIPITOR) 20 MG tablet TAKE 1 TABLET BY MOUTH EVERY EVENING. 90 tablet 3  . carvedilol (COREG) 12.5 MG  tablet TAKE 1 TABLET BY MOUTH TWICE A DAY WITH A MEAL 180 tablet 3  . clopidogrel (PLAVIX) 75 MG tablet Take 1 tablet (75 mg total) by mouth daily. 90 tablet 3  . lisinopril (PRINIVIL,ZESTRIL) 40 MG tablet Take 1 tablet (40 mg total) by mouth daily. 90 tablet 3  . nitroGLYCERIN (NITROSTAT) 0.4 MG SL tablet Place 1 tablet (0.4 mg total) under the tongue every 5 (five) minutes as needed for chest pain. 25 tablet 2  . pantoprazole (PROTONIX) 40 MG tablet TAKE 1 TABLET BY MOUTH DAILY 30 tablet 0   No current facility-administered medications for this visit.     Allergies:   Carvedilol and Hydrochlorothiazide   Social History:  The patient  reports that he has been smoking.  He has never used smokeless tobacco. He reports that he does not drink alcohol or use drugs.   Family History:  The patient's  family history includes Diabetes in his father; Heart disease in his mother; Other in his unknown relative.    ROS:  Please see the history of present illness.  All other systems are reviewed and negative.    PHYSICAL EXAM: VS:  BP 132/88   Pulse 72   Ht 5\' 4"  (1.626 m)   Wt 171 lb (77.6 kg)   SpO2 97%   BMI 29.35 kg/m  , BMI Body mass index is 29.35 kg/m. GEN: Well nourished, well developed, in no acute distress  HEENT: normal  Neck: no JVD, no masses. No carotid bruits Cardiac: RRR without murmur or gallop                Respiratory:  clear to auscultation bilaterally, normal work of breathing GI: soft, nontender, nondistended, + BS MS: no deformity or atrophy  Ext: no pretibial edema, pedal pulses 2+= bilaterally Skin: warm and dry, no rash Neuro:  Strength and sensation are intact Psych: euthymic mood, full affect  EKG:  EKG is ordered today. The ekg ordered today shows normal sinus rhythm 72 bpm, within normal limits.  Recent Labs: 09/18/2016: ALT 10; BUN 12; Creatinine, Ser 0.82; Potassium 4.4; Sodium 139   Lipid Panel     Component Value Date/Time   CHOL 135 09/18/2016  0832   TRIG 235 (H) 09/18/2016 0832   HDL 34 (L) 09/18/2016 0832   CHOLHDL 4.0 09/18/2016 0832   CHOLHDL 5 06/07/2014 0741   VLDL 55.2 (H) 06/07/2014 0741   LDLCALC 54 09/18/2016 0832   LDLDIRECT 64.7 06/07/2014 0741      Wt Readings from Last 3 Encounters:  08/18/17 171 lb (77.6 kg)  08/15/16 168 lb 3.2 oz (76.3 kg)  10/29/15 163 lb 12.8 oz (74.3 kg)     ASSESSMENT AND PLAN: 1.  HTN: Patient with long-standing severe whitecoat hypertension.  Today's blood pressure is the best I have seen from him in the office.  Amlodipine was started when he was seen last year.  He continues on carvedilol and lisinopril.  He will continue his current regimen.  2. CAD, native vessel, with angina: Overall symptoms are improved from the past.  I suspect the addition of amlodipine has helped.  He continues on long-term dual antiplatelet therapy with aspirin and clopidogrel as he has never had bleeding problems and was treated with a first generation drug-eluting stent in the context of an acute MI.  Unfortunately he continues to smoke cigarettes.  3. Hyperlipidemia: Lipids have been excellent.  He wants to lower his atorvastatin dose but I convinced him to continue on 20 mg daily.  He does not seem to be having any adverse effects.  4. Tobacco abuse: Cessation counseling done.  I do not anticipate that he will quit.  Current medicines are reviewed with the patient today.  The patient does not have concerns regarding medicines.  Labs/ tests ordered today include:   Orders Placed This Encounter  Procedures  . Comprehensive metabolic panel  . Lipid panel  . CBC with Differential/Platelet  . EKG 12-Lead    Disposition:   FU one year  Signed, [name], MD  08/19/2017 10:45 AM    [address], [place], [place]  [address] Phone: [phone]; Fax: [phone]

## 2017-08-18 NOTE — Patient Instructions (Signed)
Medication Instructions:  Your provider recommends that you continue on your current medications as directed. Please refer to the Current Medication list given to you today.    Labwork: You have an appointment for FASTING labs May 1st.  Testing/Procedures: None  Follow-Up: Your provider wants you to follow-up in: 1 year with Dr. Burt Knack. You will receive a reminder letter in the mail two months in advance. If you don't receive a letter, please call our office to schedule the follow-up appointment.    Any Other Special Instructions Will Be Listed Below (If Applicable).     If you need a refill on your cardiac medications before your next appointment, please call your pharmacy.

## 2017-08-19 ENCOUNTER — Encounter: Payer: Self-pay | Admitting: Cardiovascular Disease

## 2017-09-06 ENCOUNTER — Other Ambulatory Visit: Payer: Self-pay | Admitting: Cardiovascular Disease

## 2017-09-10 ENCOUNTER — Other Ambulatory Visit: Payer: Self-pay | Admitting: Cardiovascular Disease

## 2017-09-24 ENCOUNTER — Other Ambulatory Visit: Payer: Medicare Other

## 2017-09-26 ENCOUNTER — Other Ambulatory Visit: Payer: Medicare Other | Admitting: *Deleted

## 2017-09-26 DIAGNOSIS — I25119 Atherosclerotic heart disease of native coronary artery with unspecified angina pectoris: Secondary | ICD-10-CM

## 2017-09-26 DIAGNOSIS — I10 Essential (primary) hypertension: Secondary | ICD-10-CM

## 2017-09-26 LAB — COMPREHENSIVE METABOLIC PANEL WITH GFR
ALT: 11 IU/L (ref 0–44)
AST: 6 IU/L (ref 0–40)
Albumin/Globulin Ratio: 2.2 (ref 1.2–2.2)
Albumin: 4.6 g/dL (ref 3.6–4.8)
Alkaline Phosphatase: 86 IU/L (ref 39–117)
BUN/Creatinine Ratio: 12 (ref 10–24)
BUN: 10 mg/dL (ref 8–27)
Bilirubin Total: 0.5 mg/dL (ref 0.0–1.2)
CO2: 23 mmol/L (ref 20–29)
Calcium: 9 mg/dL (ref 8.6–10.2)
Chloride: 102 mmol/L (ref 96–106)
Creatinine, Ser: 0.81 mg/dL (ref 0.76–1.27)
GFR calc Af Amer: 107 mL/min/1.73
GFR calc non Af Amer: 93 mL/min/1.73
Globulin, Total: 2.1 g/dL (ref 1.5–4.5)
Glucose: 114 mg/dL — ABNORMAL HIGH (ref 65–99)
Potassium: 4.6 mmol/L (ref 3.5–5.2)
Sodium: 139 mmol/L (ref 134–144)
Total Protein: 6.7 g/dL (ref 6.0–8.5)

## 2017-09-26 LAB — LIPID PANEL
CHOL/HDL RATIO: 3.8 ratio (ref 0.0–5.0)
Cholesterol, Total: 134 mg/dL (ref 100–199)
HDL: 35 mg/dL — ABNORMAL LOW (ref >39)
LDL Calculated: 47 mg/dL (ref 0–99)
TRIGLYCERIDES: 261 mg/dL — AB (ref 0–149)
VLDL Cholesterol Cal: 52 mg/dL — ABNORMAL HIGH (ref 5–40)

## 2017-09-26 LAB — CBC WITH DIFFERENTIAL/PLATELET
BASOS: 1 %
Basophils Absolute: 0.1 10*3/uL (ref 0.0–0.2)
EOS (ABSOLUTE): 0.3 10*3/uL (ref 0.0–0.4)
EOS: 4 %
HEMATOCRIT: 43 % (ref 37.5–51.0)
HEMOGLOBIN: 14.6 g/dL (ref 13.0–17.7)
Immature Grans (Abs): 0 10*3/uL (ref 0.0–0.1)
Immature Granulocytes: 0 %
LYMPHS ABS: 2.3 10*3/uL (ref 0.7–3.1)
Lymphs: 30 %
MCH: 29.9 pg (ref 26.6–33.0)
MCHC: 34 g/dL (ref 31.5–35.7)
MCV: 88 fL (ref 79–97)
MONOCYTES: 8 %
MONOS ABS: 0.6 10*3/uL (ref 0.1–0.9)
NEUTROS ABS: 4.4 10*3/uL (ref 1.4–7.0)
Neutrophils: 57 %
Platelets: 291 10*3/uL (ref 150–379)
RBC: 4.89 x10E6/uL (ref 4.14–5.80)
RDW: 13.5 % (ref 12.3–15.4)
WBC: 7.7 10*3/uL (ref 3.4–10.8)

## 2017-09-27 ENCOUNTER — Other Ambulatory Visit: Payer: Self-pay | Admitting: Cardiovascular Disease

## 2017-10-14 ENCOUNTER — Telehealth: Payer: Self-pay | Admitting: Cardiovascular Disease

## 2017-10-14 NOTE — Telephone Encounter (Signed)
Informed patient of results and verbal understanding expressed.   Encouraged diet/lifestyle modification. Patient was grateful for call.

## 2017-10-14 NOTE — Telephone Encounter (Signed)
Pt calling in   Pt returning call concerning lab results. Please call pt.

## 2017-10-14 NOTE — Telephone Encounter (Signed)
-----   Message from [name], MD sent at 09/29/2017  6:12 AM EDT ----- Labs look ok except for elevated triglycerides. Not significantly changed from past readings. Continue same Rx. Work on diet/lifestyle modification.

## 2017-12-09 DIAGNOSIS — M9904 Segmental and somatic dysfunction of sacral region: Secondary | ICD-10-CM | POA: Diagnosis not present

## 2017-12-09 DIAGNOSIS — M9903 Segmental and somatic dysfunction of lumbar region: Secondary | ICD-10-CM | POA: Diagnosis not present

## 2017-12-09 DIAGNOSIS — M5136 Other intervertebral disc degeneration, lumbar region: Secondary | ICD-10-CM | POA: Diagnosis not present

## 2017-12-09 DIAGNOSIS — M9905 Segmental and somatic dysfunction of pelvic region: Secondary | ICD-10-CM | POA: Diagnosis not present

## 2017-12-11 DIAGNOSIS — M5136 Other intervertebral disc degeneration, lumbar region: Secondary | ICD-10-CM | POA: Diagnosis not present

## 2017-12-11 DIAGNOSIS — M9903 Segmental and somatic dysfunction of lumbar region: Secondary | ICD-10-CM | POA: Diagnosis not present

## 2017-12-11 DIAGNOSIS — M9905 Segmental and somatic dysfunction of pelvic region: Secondary | ICD-10-CM | POA: Diagnosis not present

## 2017-12-11 DIAGNOSIS — M9904 Segmental and somatic dysfunction of sacral region: Secondary | ICD-10-CM | POA: Diagnosis not present

## 2017-12-15 DIAGNOSIS — M9904 Segmental and somatic dysfunction of sacral region: Secondary | ICD-10-CM | POA: Diagnosis not present

## 2017-12-15 DIAGNOSIS — M9905 Segmental and somatic dysfunction of pelvic region: Secondary | ICD-10-CM | POA: Diagnosis not present

## 2017-12-15 DIAGNOSIS — M5136 Other intervertebral disc degeneration, lumbar region: Secondary | ICD-10-CM | POA: Diagnosis not present

## 2017-12-15 DIAGNOSIS — M9903 Segmental and somatic dysfunction of lumbar region: Secondary | ICD-10-CM | POA: Diagnosis not present

## 2017-12-18 DIAGNOSIS — M9904 Segmental and somatic dysfunction of sacral region: Secondary | ICD-10-CM | POA: Diagnosis not present

## 2017-12-18 DIAGNOSIS — M5136 Other intervertebral disc degeneration, lumbar region: Secondary | ICD-10-CM | POA: Diagnosis not present

## 2017-12-18 DIAGNOSIS — M9903 Segmental and somatic dysfunction of lumbar region: Secondary | ICD-10-CM | POA: Diagnosis not present

## 2017-12-18 DIAGNOSIS — M9905 Segmental and somatic dysfunction of pelvic region: Secondary | ICD-10-CM | POA: Diagnosis not present

## 2018-08-17 ENCOUNTER — Other Ambulatory Visit: Payer: Self-pay | Admitting: Cardiovascular Disease

## 2018-09-05 ENCOUNTER — Other Ambulatory Visit: Payer: Self-pay | Admitting: Cardiovascular Disease

## 2018-09-22 ENCOUNTER — Telehealth: Payer: Self-pay | Admitting: Cardiovascular Disease

## 2018-09-22 NOTE — Telephone Encounter (Signed)
Patient set up for MyChart? NO  Is patient using Smartphone/computer/tablet? YES  Did audio/video work?  Does patient need telephone visit? NO  Best phone number to use? [phone]  Special Instructions? PT DOES NOT HAVE SCALE, BUT HE WILL HAVE BP AND MEDICATION LIST READY      Virtual Visit Pre-Appointment Phone Call  "(Name), I am calling you today to discuss your upcoming appointment. We are currently trying to limit exposure to the virus that causes COVID-19 by seeing patients at home rather than in the office."  1. "What is the BEST phone number to call the day of the visit?" - include this in appointment notes  2. Do you have or have access to (through a family member/friend) a smartphone with video capability that we can use for your visit?" a. If yes - list this number in appt notes as cell (if different from BEST phone #) and list the appointment type as a VIDEO visit in appointment notes b. If no - list the appointment type as a PHONE visit in appointment notes  3. Confirm consent - "In the setting of the current Covid19 crisis, you are scheduled for a (phone or video) visit with your provider on (date) at (time).  Just as we do with many in-office visits, in order for you to participate in this visit, we must obtain consent.  If you'd like, I can send this to your mychart (if signed up) or email for you to review.  Otherwise, I can obtain your verbal consent now.  All virtual visits are billed to your insurance company just like a normal visit would be.  By agreeing to a virtual visit, we'd like you to understand that the technology does not allow for your provider to perform an examination, and thus may limit your provider's ability to fully assess your condition. If your provider identifies any concerns that need to be evaluated in person, we will make arrangements to do so.  Finally, though the technology is pretty good, we cannot assure that it will always work on either  your or our end, and in the setting of a video visit, we may have to convert it to a phone-only visit.  In either situation, we cannot ensure that we have a secure connection.  Are you willing to proceed?" STAFF: Did the patient verbally acknowledge consent to telehealth visit? Document YES/NO here: YES  4. Advise patient to be prepared - "Two hours prior to your appointment, go ahead and check your blood pressure, pulse, oxygen saturation, and your weight (if you have the equipment to check those) and write them all down. When your visit starts, your provider will ask you for this information. If you have an Apple Watch or Kardia device, please plan to have heart rate information ready on the day of your appointment. Please have a pen and paper handy nearby the day of the visit as well."  5. Give patient instructions for MyChart download to smartphone OR Doximity/Doxy.me as below if video visit (depending on what platform provider is using)  6. Inform patient they will receive a phone call 15 minutes prior to their appointment time (may be from unknown caller ID) so they should be prepared to answer    TELEPHONE CALL NOTE  Alexander [name] has been deemed a candidate for a follow-up tele-health visit to limit community exposure during the Covid-19 pandemic. I spoke with the patient via phone to ensure availability of phone/video source, confirm preferred email &  phone number, and discuss instructions and expectations.  I reminded Alexander [name] to be prepared with any vital sign and/or heart rhythm information that could potentially be obtained via home monitoring, at the time of his visit. I reminded Alexander [name] to expect a phone call prior to his visit.  Alexander [name] 09/22/2018 3:19 PM   INSTRUCTIONS FOR DOWNLOADING THE MYCHART APP TO SMARTPHONE  - The patient must first make sure to have activated MyChart and know their login information - If Apple, go to [employer] and type in MyChart in  the search bar and download the app. If Android, ask patient to go to [employer] and type in Spry in the search bar and download the app. The app is free but as with any other app downloads, their phone may require them to verify saved payment information or Apple/Android password.  - The patient will need to then log into the app with their MyChart username and password, and select [facility] as their healthcare provider to link the account. When it is time for your visit, go to the MyChart app, find appointments, and click Begin Video Visit. Be sure to Select Allow for your device to access the Microphone and Camera for your visit. You will then be connected, and your provider will be with you shortly.  **If they have any issues connecting, or need assistance please contact MyChart service desk [phone])**  **If using a computer, in order to ensure the best quality for their visit they will need to use either of the following Internet Browsers: [employer], or Google Chrome**  IF USING DOXIMITY or DOXY.ME - The patient will receive a link just prior to their visit by text.     FULL LENGTH CONSENT FOR TELE-HEALTH VISIT   I hereby voluntarily request, consent and authorize [facility] and its employed or contracted physicians, physician assistants, nurse practitioners or other licensed health care professionals (the Practitioner), to provide me with telemedicine health care services (the Services") as deemed necessary by the treating Practitioner. I acknowledge and consent to receive the Services by the Practitioner via telemedicine. I understand that the telemedicine visit will involve communicating with the Practitioner through live audiovisual communication technology and the disclosure of certain medical information by electronic transmission. I acknowledge that I have been given the opportunity to request an in-person assessment or other available alternative  prior to the telemedicine visit and am voluntarily participating in the telemedicine visit.  I understand that I have the right to withhold or withdraw my consent to the use of telemedicine in the course of my care at any time, without affecting my right to future care or treatment, and that the Practitioner or I may terminate the telemedicine visit at any time. I understand that I have the right to inspect all information obtained and/or recorded in the course of the telemedicine visit and may receive copies of available information for a reasonable fee.  I understand that some of the potential risks of receiving the Services via telemedicine include:   Delay or interruption in medical evaluation due to technological equipment failure or disruption;  Information transmitted may not be sufficient (e.g. poor resolution of images) to allow for appropriate medical decision making by the Practitioner; and/or   In rare instances, security protocols could fail, causing a breach of personal health information.  Furthermore, I acknowledge that it is my responsibility to provide information about my medical history, conditions and care that  is complete and accurate to the best of my ability. I acknowledge that Practitioner's advice, recommendations, and/or decision may be based on factors not within their control, such as incomplete or inaccurate data provided by me or distortions of diagnostic images or specimens that may result from electronic transmissions. I understand that the practice of medicine is not an exact science and that Practitioner makes no warranties or guarantees regarding treatment outcomes. I acknowledge that I will receive a copy of this consent concurrently upon execution via email to the email address I last provided but may also request a printed copy by calling the office of [facility].    I understand that my insurance will be billed for this visit.   I have read or had this  consent read to me.  I understand the contents of this consent, which adequately explains the benefits and risks of the Services being provided via telemedicine.   I have been provided ample opportunity to ask questions regarding this consent and the Services and have had my questions answered to my satisfaction.  I give my informed consent for the services to be provided through the use of telemedicine in my medical care  By participating in this telemedicine visit I agree to the above.

## 2018-10-06 ENCOUNTER — Other Ambulatory Visit: Payer: Self-pay | Admitting: Cardiovascular Disease

## 2018-10-09 ENCOUNTER — Other Ambulatory Visit: Payer: Self-pay

## 2018-10-09 ENCOUNTER — Telehealth: Payer: Self-pay | Admitting: Cardiovascular Disease

## 2018-10-09 ENCOUNTER — Telehealth (INDEPENDENT_AMBULATORY_CARE_PROVIDER_SITE_OTHER): Payer: Medicare Other | Admitting: Cardiovascular Disease

## 2018-10-09 ENCOUNTER — Encounter: Payer: Self-pay | Admitting: Cardiovascular Disease

## 2018-10-09 VITALS — BP 133/78 | HR 68 | Ht 65.0 in

## 2018-10-09 DIAGNOSIS — I2510 Atherosclerotic heart disease of native coronary artery without angina pectoris: Secondary | ICD-10-CM

## 2018-10-09 DIAGNOSIS — E782 Mixed hyperlipidemia: Secondary | ICD-10-CM

## 2018-10-09 DIAGNOSIS — I10 Essential (primary) hypertension: Secondary | ICD-10-CM

## 2018-10-09 MED ORDER — AMLODIPINE BESYLATE 5 MG PO TABS: 5.0000 mg | ORAL_TABLET | Freq: Every day | ORAL | 3 refills | Status: DC

## 2018-10-09 NOTE — Telephone Encounter (Signed)
New Message            Patient is waiting on the link for his 8"30 appointment [phone], Patient states he does not have a camera on his cell phone. Pls call to advise.

## 2018-10-09 NOTE — Telephone Encounter (Signed)
E-visit completed this morning.

## 2018-10-09 NOTE — Progress Notes (Signed)
Virtual Visit via Video Note   This visit type was conducted due to national recommendations for restrictions regarding the COVID-19 Pandemic (e.g. social distancing) in an effort to limit this patient's exposure and mitigate transmission in our community.  Due to his co-morbid illnesses, this patient is at least at moderate risk for complications without adequate follow up.  This format is felt to be most appropriate for this patient at this time.  All issues noted in this document were discussed and addressed.  A limited physical exam was performed with this format.  Please refer to the patient's chart for his consent to telehealth for [facility].   Date:  10/09/2018   ID:  Alexander [name], DOB February 19, 1951, MRN [id number]  Patient Location: Home Provider Location: Office  PCP:  Patient, No Pcp Per  Cardiologist:  [name], MD  Electrophysiologist:  None   Evaluation Performed:  Follow-Up Visit  Chief Complaint:  CAD/HTN follow-up  History of Present Illness:    Alexander [name] is a 68 y.o. male with CAD, presenting for follow-up evaluation.   Patient initially presented in 2008 with an acute inferior wall MI, treated with primary PCI using overlapping Taxus drug-eluting stents the right coronary artery. He has been maintained on long-term dual antiplatelet therapy with aspirin and Plavix. Patient has long-standing whitecoat hypertension.   He's redoing his garage and he denies any symptoms with physical exertion. No chest pain or shortness of breath. He denies edema, orthopnea, or PND.  The patient does not have symptoms concerning for COVID-19 infection (fever, chills, cough, or new shortness of breath).    Past Medical History:  Diagnosis Date  . Anxiety   . CAD (coronary artery disease)   . GERD (gastroesophageal reflux disease)   . HTN (hypertension)   . Hyperlipidemia   . Myocardial infarct (HCC)     MYOCARDIAL INFARCTION, INFERIOR WALL, INITIAL EPISODE  (ICD-410.41)RX OVERLAPPING  DRUG-ELUDTING STENTS RCA WITH RESIDUAL DISEASE LAD & CFX. EF INITIALLY 40% NOW 60-65%   Past Surgical History:  Procedure Laterality Date  . CARDIAC CATHETERIZATION    . CORONARY ANGIOPLASTY       Current Meds  Medication Sig  . amLODipine (NORVASC) 5 MG tablet Take 1 tablet (5 mg total) by mouth daily. Please keep appt for future refills.  Marland Kitchen aspirin EC 81 MG tablet Take 81 mg by mouth daily.  Marland Kitchen atorvastatin (LIPITOR) 20 MG tablet TAKE 1 TABLET BY MOUTH EVERY EVENING.  . carvedilol (COREG) 12.5 MG tablet TAKE 1 TABLET BY MOUTH TWICE A DAY WITH A MEAL  . clopidogrel (PLAVIX) 75 MG tablet TAKE 1 TABLET (75 MG TOTAL) BY MOUTH DAILY.  Marland Kitchen lisinopril (PRINIVIL,ZESTRIL) 40 MG tablet TAKE 1 TABLET BY MOUTH EVERY DAY  . nitroGLYCERIN (NITROSTAT) 0.4 MG SL tablet Place 1 tablet (0.4 mg total) under the tongue every 5 (five) minutes as needed for chest pain.  . pantoprazole (PROTONIX) 40 MG tablet TAKE 1 TABLET BY MOUTH EVERY DAY     Allergies:   Carvedilol and Hydrochlorothiazide   Social History   Tobacco Use  . Smoking status: Current Every Day Smoker  . Smokeless tobacco: Never Used  Substance Use Topics  . Alcohol use: No  . Drug use: No     Family Hx: The patient's family history includes Diabetes in his father; Heart disease in his mother; Other in his unknown relative.  ROS:   Please see the history of present illness.    All other systems  reviewed and are negative.   Labs/Other Tests and Data Reviewed:    EKG:  An ECG dated 08/18/2017 was personally reviewed today and demonstrated:  NSR 72 bpm, within normal limits  Recent Labs: No results found for requested labs within last 8760 hours.   Recent Lipid Panel Lab Results  Component Value Date/Time   CHOL 134 09/26/2017 09:10 AM   TRIG 261 (H) 09/26/2017 09:10 AM   HDL 35 (L) 09/26/2017 09:10 AM   CHOLHDL 3.8 09/26/2017 09:10 AM   CHOLHDL 5 06/07/2014 07:41 AM   LDLCALC 47 09/26/2017 09:10  AM   LDLDIRECT 64.7 06/07/2014 07:41 AM    Wt Readings from Last 3 Encounters:  08/18/17 171 lb (77.6 kg)  08/15/16 168 lb 3.2 oz (76.3 kg)  10/29/15 163 lb 12.8 oz (74.3 kg)     Objective:    Vital Signs:  Ht 5\' 5"  (1.651 m)   BMI 28.46 kg/m    VITAL SIGNS:  reviewed Alert, oriented, in NAD. Breathing comfortably in normal conversation.  Remaining exam deferred as this is a telephone visit.   ASSESSMENT & PLAN:    1. HTN: BP controlled on current Rx - carvedilol, lisinopril, amlodipine.  2. Mixed hyperlipidemia: on atorvastatin 20 mg. Will set up labs when we are able to do routine lab work with Covid 19 restrictions (CBC, CMET, Lipid panel) 3. CAD, native vessel, without angina: continue current Rx. On long term DAPT in setting multiple CV risk factors and first generation DES. 4. Tobacco: not ready to quit.   COVID-19 Education: The signs and symptoms of COVID-19 were discussed with the patient and how to seek care for testing (follow up with PCP or arrange E-visit).  The importance of social distancing was discussed today.  Time:   Today, I have spent 15 minutes with the patient with telehealth technology discussing the above problems.     Medication Adjustments/Labs and Tests Ordered: Current medicines are reviewed at length with the patient today.  Concerns regarding medicines are outlined above.   Tests Ordered: No orders of the defined types were placed in this encounter.   Medication Changes: No orders of the defined types were placed in this encounter.   Disposition:  Follow up in 1 year(s)  Signed, [name], MD  10/09/2018 9:04 AM    [facility]

## 2018-10-09 NOTE — Patient Instructions (Signed)
Medication Instructions:  Your provider recommends that you continue on your current medications as directed. Please refer to the Current Medication list given to you today.   If you need a refill on your cardiac medications before your next appointment, please call your pharmacy.   Lab work: You are scheduled for lab work on Monday, November 09, 2018. You may come any time between 7:30AM and 4:30PM. Remember to come fasting! If you have labs (blood work) drawn today and your tests are completely normal, you will receive your results only by: Marland Kitchen MyChart Message (if you have MyChart) OR . A paper copy in the mail If you have any lab test that is abnormal or we need to change your treatment, we will call you to review the results.  Follow-Up: At [facility], you and your health needs are our priority.  As part of our continuing mission to provide you with exceptional heart care, we have created designated Provider Care Teams.  These Care Teams include your primary Cardiologist (physician) and Advanced Practice Providers (APPs -  Physician Assistants and Nurse Practitioners) who all work together to provide you with the care you need, when you need it. You will need a follow up appointment in:  12 months.  Please call our office 2 months in advance to schedule this appointment.  You may see [name], MD or one of the following Advanced Practice Providers on your designated Care Team: Richardson Dopp, PA-C [place], [place] . Daune Perch, NP

## 2018-11-09 ENCOUNTER — Other Ambulatory Visit: Payer: Medicare Other

## 2018-11-16 ENCOUNTER — Telehealth: Payer: Self-pay | Admitting: *Deleted

## 2018-11-16 NOTE — Telephone Encounter (Signed)
    COVID-19 Pre-Screening Questions:  . In the past 7 to 10 days have you had a cough,  shortness of breath, headache, congestion, fever (100 or greater) body aches, chills, sore throat, or sudden loss of taste or sense of smell? . Have you been around anyone with known Covid 19. . Have you been around anyone who is awaiting Covid 19 test results in the past 7 to 10 days? . Have you been around anyone who has been exposed to Covid 19, or has mentioned symptoms of Covid 19 within the past 7 to 10 days?  If you have any concerns/questions about symptoms patients report during screening (either on the phone or at threshold). Contact the provider seeing the patient or DOD for further guidance.  If neither are available contact a member of the leadership team.          Contacted patient via telephone call. NO to all Covid 19 questions and has a mask. KB

## 2018-11-18 ENCOUNTER — Other Ambulatory Visit: Payer: Medicare Other

## 2018-12-01 ENCOUNTER — Other Ambulatory Visit: Payer: Medicare Other

## 2018-12-07 ENCOUNTER — Other Ambulatory Visit: Payer: Medicare Other | Admitting: *Deleted

## 2018-12-07 ENCOUNTER — Other Ambulatory Visit: Payer: Self-pay

## 2018-12-07 ENCOUNTER — Encounter (INDEPENDENT_AMBULATORY_CARE_PROVIDER_SITE_OTHER): Payer: Self-pay

## 2018-12-07 DIAGNOSIS — I2510 Atherosclerotic heart disease of native coronary artery without angina pectoris: Secondary | ICD-10-CM

## 2018-12-07 DIAGNOSIS — E782 Mixed hyperlipidemia: Secondary | ICD-10-CM

## 2018-12-07 LAB — COMPREHENSIVE METABOLIC PANEL
ALT: 10 IU/L (ref 0–44)
AST: 6 IU/L (ref 0–40)
Albumin/Globulin Ratio: 1.8 (ref 1.2–2.2)
Albumin: 4.2 g/dL (ref 3.8–4.8)
Alkaline Phosphatase: 97 IU/L (ref 39–117)
BUN/Creatinine Ratio: 11 (ref 10–24)
BUN: 10 mg/dL (ref 8–27)
Bilirubin Total: 0.5 mg/dL (ref 0.0–1.2)
CO2: 25 mmol/L (ref 20–29)
Calcium: 8.9 mg/dL (ref 8.6–10.2)
Chloride: 102 mmol/L (ref 96–106)
Creatinine, Ser: 0.95 mg/dL (ref 0.76–1.27)
GFR calc Af Amer: 95 mL/min/{1.73_m2} (ref >59)
GFR calc non Af Amer: 82 mL/min/{1.73_m2} (ref >59)
Globulin, Total: 2.3 g/dL (ref 1.5–4.5)
Glucose: 105 mg/dL — ABNORMAL HIGH (ref 65–99)
Potassium: 4.3 mmol/L (ref 3.5–5.2)
Sodium: 138 mmol/L (ref 134–144)
Total Protein: 6.5 g/dL (ref 6.0–8.5)

## 2018-12-07 LAB — CBC WITH DIFFERENTIAL/PLATELET
Basophils Absolute: 0.1 10*3/uL (ref 0.0–0.2)
Basos: 1 %
EOS (ABSOLUTE): 0.6 10*3/uL — ABNORMAL HIGH (ref 0.0–0.4)
Eos: 7 %
Hematocrit: 43.8 % (ref 37.5–51.0)
Hemoglobin: 14.5 g/dL (ref 13.0–17.7)
Immature Grans (Abs): 0 10*3/uL (ref 0.0–0.1)
Immature Granulocytes: 0 %
Lymphocytes Absolute: 2.2 10*3/uL (ref 0.7–3.1)
Lymphs: 25 %
MCH: 29.5 pg (ref 26.6–33.0)
MCHC: 33.1 g/dL (ref 31.5–35.7)
MCV: 89 fL (ref 79–97)
Monocytes Absolute: 0.7 10*3/uL (ref 0.1–0.9)
Monocytes: 8 %
Neutrophils Absolute: 5.2 10*3/uL (ref 1.4–7.0)
Neutrophils: 59 %
Platelets: 288 10*3/uL (ref 150–450)
RBC: 4.92 x10E6/uL (ref 4.14–5.80)
RDW: 12.8 % (ref 11.6–15.4)
WBC: 8.9 10*3/uL (ref 3.4–10.8)

## 2018-12-07 LAB — LIPID PANEL
Chol/HDL Ratio: 4 ratio (ref 0.0–5.0)
Cholesterol, Total: 117 mg/dL (ref 100–199)
HDL: 29 mg/dL — ABNORMAL LOW (ref >39)
LDL Calculated: 41 mg/dL (ref 0–99)
Triglycerides: 234 mg/dL — ABNORMAL HIGH (ref 0–149)
VLDL Cholesterol Cal: 47 mg/dL — ABNORMAL HIGH (ref 5–40)

## 2018-12-08 ENCOUNTER — Telehealth: Payer: Self-pay | Admitting: *Deleted

## 2018-12-08 NOTE — Telephone Encounter (Signed)
Error in opening this encounter.  Erroneous encounter.

## 2018-12-31 ENCOUNTER — Other Ambulatory Visit: Payer: Self-pay | Admitting: Cardiovascular Disease

## 2019-03-05 ENCOUNTER — Other Ambulatory Visit: Payer: Self-pay | Admitting: Cardiovascular Disease

## 2019-07-07 ENCOUNTER — Ambulatory Visit: Payer: Medicare Other | Attending: Internal Medicine

## 2019-07-07 DIAGNOSIS — Z20822 Contact with and (suspected) exposure to covid-19: Secondary | ICD-10-CM

## 2019-07-08 LAB — NOVEL CORONAVIRUS, NAA: SARS-CoV-2, NAA: DETECTED — AB

## 2019-08-07 ENCOUNTER — Other Ambulatory Visit: Payer: Self-pay | Admitting: Cardiovascular Disease

## 2019-10-29 ENCOUNTER — Encounter: Payer: Self-pay | Admitting: Cardiovascular Disease

## 2019-10-29 ENCOUNTER — Other Ambulatory Visit: Payer: Self-pay

## 2019-10-29 ENCOUNTER — Ambulatory Visit (INDEPENDENT_AMBULATORY_CARE_PROVIDER_SITE_OTHER): Payer: Medicare Other | Admitting: Cardiovascular Disease

## 2019-10-29 VITALS — BP 152/88 | HR 61 | Ht 65.0 in | Wt 160.0 lb

## 2019-10-29 DIAGNOSIS — I10 Essential (primary) hypertension: Secondary | ICD-10-CM | POA: Diagnosis not present

## 2019-10-29 DIAGNOSIS — I2510 Atherosclerotic heart disease of native coronary artery without angina pectoris: Secondary | ICD-10-CM | POA: Diagnosis not present

## 2019-10-29 DIAGNOSIS — E782 Mixed hyperlipidemia: Secondary | ICD-10-CM

## 2019-10-29 NOTE — Patient Instructions (Signed)
Medication Instructions:  Your physician recommends that you continue on your current medications as directed. Please refer to the Current Medication list given to you today.  *If you need a refill on your cardiac medications before your next appointment, please call your pharmacy*   Lab Work: CBC, CMET and Lipid just prior to seeing Dr. Burt Knack back next year.  You will need to be fasting (nothing to eat or drink after midnight except water and black coffee).  If you have labs (blood work) drawn today and your tests are completely normal, you will receive your results only by: Marland Kitchen MyChart Message (if you have MyChart) OR . A paper copy in the mail If you have any lab test that is abnormal or we need to change your treatment, we will call you to review the results.   Testing/Procedures: None   Follow-Up: At [facility], you and your health needs are our priority.  As part of our continuing mission to provide you with exceptional heart care, we have created designated Provider Care Teams.  These Care Teams include your primary Cardiologist (physician) and Advanced Practice Providers (APPs -  Physician Assistants and Nurse Practitioners) who all work together to provide you with the care you need, when you need it.  We recommend signing up for the patient portal called "MyChart".  Sign up information is provided on this After Visit Summary.  MyChart is used to connect with patients for Virtual Visits (Telemedicine).  Patients are able to view lab/test results, encounter notes, upcoming appointments, etc.  Non-urgent messages can be sent to your provider as well.   To learn more about what you can do with MyChart, go to [email or web address].    Your next appointment:   12 month(s)  The format for your next appointment:   In Person  Provider:   You may see [name], MD or one of the following Advanced Practice Providers on your designated Care Team:    Richardson Dopp, PA-C  Robbie Lis, [place]    Other Instructions

## 2019-10-29 NOTE — Progress Notes (Signed)
Cardiology Office Note:    Date:  10/29/2019   ID:  Alexander [name], DOB 10-Apr-1951, MRN [id number]  PCP:  Patient, No Pcp Per  [place] Cardiologist:  [name], MD  Day Electrophysiologist:  None   Referring MD: No ref. provider found   Chief Complaint  Patient presents with  . Coronary Artery Disease    History of Present Illness:    Alexander [name] is a 69 y.o. male with a hx of CAD, presenting for follow-up evaluation. Patient initially presented in 2008 with an acute inferior wall MI, treated with primary PCI using overlapping Taxus drug-eluting stents the right coronary artery. He has been maintained on long-term dual antiplatelet therapy with aspirin and Plavix. Patient has long-standing whitecoat hypertension.  He is here alone today.  He is doing quite well from a cardiac perspective.  He denies chest pain, chest pressure, shortness of breath, leg swelling, or claudication symptoms.  He has no heart palpitations, orthopnea, or PND.  He remains very active doing all kinds of remodeling around his house with no exertional symptoms.  He is up and down stairs many times per day and denies any problems with this.  He is a longstanding smoker with no interest in quitting.  No other issues reported.  He is compliant with his medicines.  Past Medical History:  Diagnosis Date  . Anxiety   . CAD (coronary artery disease)   . GERD (gastroesophageal reflux disease)   . HTN (hypertension)   . Hyperlipidemia   . Myocardial infarct (HCC)     MYOCARDIAL INFARCTION, INFERIOR WALL, INITIAL EPISODE (ICD-410.41)RX OVERLAPPING  DRUG-ELUDTING STENTS RCA WITH RESIDUAL DISEASE LAD & CFX. EF INITIALLY 40% NOW 60-65%    Past Surgical History:  Procedure Laterality Date  . CARDIAC CATHETERIZATION    . CORONARY ANGIOPLASTY      Current Medications: Current Meds  Medication Sig  . amLODipine (NORVASC) 5 MG tablet Take 1 tablet (5 mg total) by mouth daily.  Marland Kitchen aspirin EC 81  MG tablet Take 81 mg by mouth daily.  Marland Kitchen atorvastatin (LIPITOR) 20 MG tablet TAKE 1 TABLET BY MOUTH EVERY DAY IN THE EVENING  . carvedilol (COREG) 12.5 MG tablet TAKE 1 TABLET BY MOUTH TWICE A DAY WITH MEALS  . clopidogrel (PLAVIX) 75 MG tablet TAKE 1 TABLET BY MOUTH EVERY DAY  . lisinopril (ZESTRIL) 40 MG tablet TAKE 1 TABLET BY MOUTH EVERY DAY  . nitroGLYCERIN (NITROSTAT) 0.4 MG SL tablet Place 1 tablet (0.4 mg total) under the tongue every 5 (five) minutes as needed for chest pain.  . pantoprazole (PROTONIX) 40 MG tablet TAKE 1 TABLET BY MOUTH EVERY DAY     Allergies:   Carvedilol and Hydrochlorothiazide   Social History   Socioeconomic History  . Marital status: Married    Spouse name: Not on file  . Number of children: Not on file  . Years of education: Not on file  . Highest education level: Not on file  Occupational History  . Not on file  Tobacco Use  . Smoking status: Current Every Day Smoker  . Smokeless tobacco: Never Used  Substance and Sexual Activity  . Alcohol use: No  . Drug use: No  . Sexual activity: Not on file  Other Topics Concern  . Not on file  Social History Narrative  . Not on file   Social Determinants of Health   Financial Resource Strain:   . Difficulty of Paying Living Expenses:   Food  Insecurity:   . Worried About Charity fundraiser in the Last Year:   . Arboriculturist in the Last Year:   Transportation Needs:   . Film/video editor (Medical):   Marland Kitchen Lack of Transportation (Non-Medical):   Physical Activity:   . Days of Exercise per Week:   . Minutes of Exercise per Session:   Stress:   . Feeling of Stress :   Social Connections:   . Frequency of Communication with Friends and Family:   . Frequency of Social Gatherings with Friends and Family:   . Attends Religious Services:   . Active Member of Clubs or Organizations:   . Attends Archivist Meetings:   Marland Kitchen Marital Status:      Family History: The patient's family  history includes Diabetes in his father; Heart disease in his mother; Other in his unknown relative.  ROS:   Please see the history of present illness.    All other systems reviewed and are negative.  EKG:  EKG is ordered today.  The ekg ordered today demonstrates normal sinus rhythm 61 bpm, within normal limits.  Recent Labs: 12/07/2018: ALT 10; BUN 10; Creatinine, Ser 0.95; Hemoglobin 14.5; Platelets 288; Potassium 4.3; Sodium 138  Recent Lipid Panel    Component Value Date/Time   CHOL 117 12/07/2018 0759   TRIG 234 (H) 12/07/2018 0759   HDL 29 (L) 12/07/2018 0759   CHOLHDL 4.0 12/07/2018 0759   CHOLHDL 5 06/07/2014 0741   VLDL 55.2 (H) 06/07/2014 0741   LDLCALC 41 12/07/2018 0759   LDLDIRECT 64.7 06/07/2014 0741    Physical Exam:    VS:  BP (!) 152/88   Pulse 61   Ht _0  (1.651 m)   Wt 160 lb (72.6 kg)   SpO2 96%   BMI 26.63 kg/m     Wt Readings from Last 3 Encounters:  10/29/19 160 lb (72.6 kg)  08/18/17 171 lb (77.6 kg)  08/15/16 168 lb 3.2 oz (76.3 kg)     GEN:  Well nourished, well developed in no acute distress HEENT: Normal NECK: No JVD; No carotid bruits LYMPHATICS: No lymphadenopathy CARDIAC: RRR, no murmurs, rubs, gallops RESPIRATORY:  Clear to auscultation without rales, wheezing or rhonchi  ABDOMEN: Soft, non-tender, non-distended MUSCULOSKELETAL:  No edema; No deformity  SKIN: Warm and dry NEUROLOGIC:  Alert and oriented x 3 PSYCHIATRIC:  Normal affect   ASSESSMENT:    1. Mixed hyperlipidemia   2. Coronary artery disease involving native coronary artery of native heart without angina pectoris   3. Essential hypertension    PLAN:    In order of problems listed above:  1. Last lipids reviewed with LDL cholesterol at goal (41 mg/dL).  Continue atorvastatin 20 mg daily.  Repeat labs prior to next year's office visit.  Last ALT is normal at 10 mg/dL. 2. The patient is stable with no symptoms of angina.  We have maintained him on long-term  aspirin and clopidogrel in the setting of first generation Taxus drug-eluting stents many years ago.  He has had no bleeding problems.  Secondary risk reduction measures with an ACE inhibitor, beta-blocker, and statin drug. 3. Home blood pressures are controlled on amlodipine, carvedilol, and lisinopril.  Longstanding whitecoat hypertension.  I have treated his blood pressure more aggressively in the past based on office readings, and he developed symptomatic hypotension.  Continue current therapy.  Last labs reviewed with a creatinine of 0.95 mg/dL and potassium of 4.3 mg/dL.  Repeat labs prior to next year's visit.  Overall the patient is doing well.  He continues to smoke and is not ready to quit.  Otherwise no issues identified and he is compliant with his medicines.   Medication Adjustments/Labs and Tests Ordered: Current medicines are reviewed at length with the patient today.  Concerns regarding medicines are outlined above.  Orders Placed This Encounter  Procedures  . CBC  . Comp Met (CMET)  . Lipid panel  . EKG 12-Lead   No orders of the defined types were placed in this encounter.   Patient Instructions  Medication Instructions:  Your physician recommends that you continue on your current medications as directed. Please refer to the Current Medication list given to you today.  *If you need a refill on your cardiac medications before your next appointment, please call your pharmacy*   Lab Work: CBC, CMET and Lipid just prior to seeing Dr. Burt Knack back next year.  You will need to be fasting (nothing to eat or drink after midnight except water and black coffee).  If you have labs (blood work) drawn today and your tests are completely normal, you will receive your results only by: Marland Kitchen MyChart Message (if you have MyChart) OR . A paper copy in the mail If you have any lab test that is abnormal or we need to change your treatment, we will call you to review the  results.   Testing/Procedures: None   Follow-Up: At [facility], you and your health needs are our priority.  As part of our continuing mission to provide you with exceptional heart care, we have created designated Provider Care Teams.  These Care Teams include your primary Cardiologist (physician) and Advanced Practice Providers (APPs -  Physician Assistants and Nurse Practitioners) who all work together to provide you with the care you need, when you need it.  We recommend signing up for the patient portal called "MyChart".  Sign up information is provided on this After Visit Summary.  MyChart is used to connect with patients for Virtual Visits (Telemedicine).  Patients are able to view lab/test results, encounter notes, upcoming appointments, etc.  Non-urgent messages can be sent to your provider as well.   To learn more about what you can do with MyChart, go to [email or web address].    Your next appointment:   12 month(s)  The format for your next appointment:   In Person  Provider:   You may see [name], MD or one of the following Advanced Practice Providers on your designated Care Team:    Richardson Dopp, PA-C  Robbie Lis, [place]    Other Instructions      Signed, [name], MD  10/29/2019 10:15 AM    Alexander [name]

## 2019-12-02 ENCOUNTER — Other Ambulatory Visit: Payer: Self-pay | Admitting: Cardiovascular Disease

## 2020-08-31 ENCOUNTER — Other Ambulatory Visit: Payer: Self-pay | Admitting: Cardiovascular Disease

## 2020-09-09 ENCOUNTER — Other Ambulatory Visit: Payer: Self-pay | Admitting: Cardiovascular Disease

## 2020-09-21 ENCOUNTER — Other Ambulatory Visit: Payer: Self-pay | Admitting: Cardiovascular Disease

## 2020-10-23 ENCOUNTER — Other Ambulatory Visit: Payer: Self-pay | Admitting: Cardiovascular Disease

## 2020-11-27 ENCOUNTER — Other Ambulatory Visit: Payer: Self-pay | Admitting: Cardiovascular Disease

## 2020-11-29 ENCOUNTER — Telehealth: Payer: Self-pay | Admitting: Cardiovascular Disease

## 2020-11-29 NOTE — Telephone Encounter (Signed)
Spoke with the patient's spouse who states that she called in April when they received a letter in the mail in regards to scheduling the patient's yearly follow up. She states at that time she was told that there were no availabilities but that they would be called in June for when a spot opened up. She states that she never heard from anyone so she called back today and was told that they now could not be seen until December 2022.  Patient's wife states that she is very frustrated and upset with this process.  Advised that we are working to find the patient an appointment and will let them know ASAP.

## 2020-11-29 NOTE — Telephone Encounter (Signed)
Alexander [name] is returning [place] call. Please advise.

## 2020-11-29 NOTE — Telephone Encounter (Signed)
Patient's spouse called and wants to talk with Dr. Burt Knack or nurse asap. Says that it is unacceptable for him not to be able to be seen due to schedule being full. Was told that patient would be put on the wait list for an opening.

## 2020-11-29 NOTE — Telephone Encounter (Signed)
Left message for the patient's spouse to call back.

## 2020-12-07 ENCOUNTER — Other Ambulatory Visit: Payer: Self-pay | Admitting: Cardiovascular Disease

## 2020-12-08 ENCOUNTER — Other Ambulatory Visit: Payer: Self-pay | Admitting: Cardiovascular Disease

## 2020-12-12 ENCOUNTER — Other Ambulatory Visit: Payer: Self-pay | Admitting: Cardiovascular Disease

## 2020-12-14 ENCOUNTER — Other Ambulatory Visit: Payer: Self-pay | Admitting: Cardiovascular Disease

## 2020-12-19 ENCOUNTER — Other Ambulatory Visit: Payer: Self-pay

## 2020-12-19 ENCOUNTER — Ambulatory Visit: Payer: Medicare HMO | Admitting: Cardiovascular Disease

## 2020-12-19 ENCOUNTER — Encounter: Payer: Self-pay | Admitting: Cardiovascular Disease

## 2020-12-19 VITALS — BP 140/82 | HR 70 | Ht 64.0 in | Wt 171.0 lb

## 2020-12-19 DIAGNOSIS — E782 Mixed hyperlipidemia: Secondary | ICD-10-CM

## 2020-12-19 DIAGNOSIS — I2510 Atherosclerotic heart disease of native coronary artery without angina pectoris: Secondary | ICD-10-CM | POA: Diagnosis not present

## 2020-12-19 DIAGNOSIS — I10 Essential (primary) hypertension: Secondary | ICD-10-CM

## 2020-12-19 DIAGNOSIS — Z720 Tobacco use: Secondary | ICD-10-CM

## 2020-12-19 NOTE — Patient Instructions (Signed)
Medication Instructions:  Your physician recommends that you continue on your current medications as directed. Please refer to the Current Medication list given to you today.  *If you need a refill on your cardiac medications before your next appointment, please call your pharmacy*   Lab Work: TODAY- CBC, Cholesterol, Liver panel, Basic metabolic panel  If you have labs (blood work) drawn today and your tests are completely normal, you will receive your results only by: MyChart Message (if you have MyChart) OR A paper copy in the mail If you have any lab test that is abnormal or we need to change your treatment, we will call you to review the results.   Testing/Procedures: None Ordered   Follow-Up: At [facility], you and your health needs are our priority.  As part of our continuing mission to provide you with exceptional heart care, we have created designated Provider Care Teams.  These Care Teams include your primary Cardiologist (physician) and Advanced Practice Providers (APPs -  Physician Assistants and Nurse Practitioners) who all work together to provide you with the care you need, when you need it.   Your next appointment:   1 year(s)  The format for your next appointment:   In Person  Provider:   You may see [name], MD or one of the following Advanced Practice Providers on your designated Care Team:   Richardson Dopp, PA-C [place], [place]

## 2020-12-19 NOTE — Progress Notes (Signed)
Cardiology Office Note:    Date:  12/19/2020   ID:  Alexander [name], DOB 24-Dec-1950, MRN [id number]  PCP:  Patient, No Pcp Per (Inactive)   [place] Providers Cardiologist:  [name], MD     Referring MD: No ref. provider found   Chief Complaint  Patient presents with   Coronary Artery Disease    History of Present Illness:    Alexander [name] is a 70 y.o. male with a hx of CAD, presenting for follow-up evaluation. Patient initially presented in 2008 with an acute inferior wall MI, treated with primary PCI using overlapping Taxus drug-eluting stents the right coronary artery. He has been maintained on long-term dual antiplatelet therapy with aspirin and Plavix. Patient has long-standing whitecoat hypertension.   The patient is here alone today.  He continues to do well with no active complaints.  Occasionally has indigestion after eating a large meal.  Symptoms resolve with belching.  Otherwise no cardiovascular related complaints. Today, he denies symptoms of palpitations, chest pain, shortness of breath, orthopnea, PND, lower extremity edema, dizziness, or syncope.   Past Medical History:  Diagnosis Date   Anxiety    CAD (coronary artery disease)    GERD (gastroesophageal reflux disease)    HTN (hypertension)    Hyperlipidemia    Myocardial infarct (HCC)     MYOCARDIAL INFARCTION, INFERIOR WALL, INITIAL EPISODE (ICD-410.41)RX OVERLAPPING  DRUG-ELUDTING STENTS RCA WITH RESIDUAL DISEASE LAD & CFX. EF INITIALLY 40% NOW 60-65%    Past Surgical History:  Procedure Laterality Date   CARDIAC CATHETERIZATION     CORONARY ANGIOPLASTY      Current Medications: Current Meds  Medication Sig   amLODipine (NORVASC) 5 MG tablet Take 1 tablet (5 mg total) by mouth daily. Pt needs to make appt with provider for further refills - 1st attempt   aspirin EC 81 MG tablet Take 81 mg by mouth daily.   atorvastatin (LIPITOR) 20 MG tablet Take 1 tablet (20 mg total) by mouth daily.    carvedilol (COREG) 12.5 MG tablet Take 1 tablet (12.5 mg total) by mouth 2 (two) times daily with a meal. Please make overdue appt with Dr. Burt Knack before anymore refills. Thank you 2nd attempt   clopidogrel (PLAVIX) 75 MG tablet Take 1 tablet (75 mg total) by mouth daily. Please make overdue appt with Dr. Burt Knack before anymore refills. Thank you 2nd attempt   lisinopril (ZESTRIL) 40 MG tablet TAKE 1 TABLET BY MOUTH DAILY   nitroGLYCERIN (NITROSTAT) 0.4 MG SL tablet Place 1 tablet (0.4 mg total) under the tongue every 5 (five) minutes as needed for chest pain.   pantoprazole (PROTONIX) 40 MG tablet Take 1 tablet (40 mg total) by mouth daily.     Allergies:   Carvedilol and Hydrochlorothiazide   Social History   Socioeconomic History   Marital status: Married    Spouse name: Not on file   Number of children: Not on file   Years of education: Not on file   Highest education level: Not on file  Occupational History   Not on file  Tobacco Use   Smoking status: Every Day   Smokeless tobacco: Never  Substance and Sexual Activity   Alcohol use: No   Drug use: No   Sexual activity: Not on file  Other Topics Concern   Not on file  Social History Narrative   Not on file   Social Determinants of Health   Financial Resource Strain: Not on file  Food Insecurity:  Not on file  Transportation Needs: Not on file  Physical Activity: Not on file  Stress: Not on file  Social Connections: Not on file     Family History: The patient's family history includes Diabetes in his father; Heart disease in his mother; Other in his unknown relative.  ROS:   Please see the history of present illness.    All other systems reviewed and are negative.  EKGs/Labs/Other Studies Reviewed:    EKG:  EKG is ordered today.  The ekg ordered today demonstrates NSR 70 bpm, within normal limits  Recent Labs: No results found for requested labs within last 8760 hours.  Recent Lipid Panel    Component Value  Date/Time   CHOL 117 12/07/2018 0759   TRIG 234 (H) 12/07/2018 0759   HDL 29 (L) 12/07/2018 0759   CHOLHDL 4.0 12/07/2018 0759   CHOLHDL 5 06/07/2014 0741   VLDL 55.2 (H) 06/07/2014 0741   LDLCALC 41 12/07/2018 0759   LDLDIRECT 64.7 06/07/2014 0741     Risk Assessment/Calculations:     Physical Exam:    VS:  BP 140/82   Pulse 70   Ht '5\' 4"'$  (1.626 m)   Wt 171 lb (77.6 kg)   SpO2 95%   BMI 29.35 kg/m     Wt Readings from Last 3 Encounters:  12/19/20 171 lb (77.6 kg)  10/29/19 160 lb (72.6 kg)  08/18/17 171 lb (77.6 kg)     GEN:  Well nourished, well developed in no acute distress HEENT: Normal NECK: No JVD; No carotid bruits LYMPHATICS: No lymphadenopathy CARDIAC: RRR, no murmurs, rubs, gallops RESPIRATORY:  Clear to auscultation without rales, wheezing or rhonchi  ABDOMEN: Soft, non-tender, non-distended MUSCULOSKELETAL:  No edema; No deformity  SKIN: Warm and dry NEUROLOGIC:  Alert and oriented x 3 PSYCHIATRIC:  Normal affect   ASSESSMENT:    1. Mixed hyperlipidemia   2. Coronary artery disease involving native coronary artery of native heart without angina pectoris   3. Essential hypertension   4. Tobacco abuse    PLAN:    In order of problems listed above:  1.  The patient is treated with atorvastatin 20 mg.  His last LDL cholesterol was at goal of less than 70 mg/dL.  He is overdue for lipids and we will arrange lab work to include lipids and LFTs. 2.  The patient is stable with no symptoms of angina.  He is on antiplatelet therapy with aspirin, statin drug, clopidogrel in the setting of first generation DES, beta-blocker, and an ACE inhibitor.  No changes are made today. 3.  Blood pressure controlled on amlodipine, carvedilol, and lisinopril.  Patient has a history of whitecoat hypertension.  Overall seems to be doing well.  We will update his labs to include a metabolic panel today. 4.  Cessation counseling done.  Patient not ready to quit.        Medication Adjustments/Labs and Tests Ordered: Current medicines are reviewed at length with the patient today.  Concerns regarding medicines are outlined above.  Orders Placed This Encounter  Procedures   CBC   Basic Metabolic Panel (BMET)   Hepatic function panel   Lipid Profile   EKG 12-Lead    No orders of the defined types were placed in this encounter.   Patient Instructions  Medication Instructions:  Your physician recommends that you continue on your current medications as directed. Please refer to the Current Medication list given to you today.  *If you need a refill  on your cardiac medications before your next appointment, please call your pharmacy*   Lab Work: TODAY- CBC, Cholesterol, Liver panel, Basic metabolic panel  If you have labs (blood work) drawn today and your tests are completely normal, you will receive your results only by: [place] (if you have MyChart) OR A paper copy in the mail If you have any lab test that is abnormal or we need to change your treatment, we will call you to review the results.   Testing/Procedures: None Ordered   Follow-Up: At [facility], you and your health needs are our priority.  As part of our continuing mission to provide you with exceptional heart care, we have created designated Provider Care Teams.  These Care Teams include your primary Cardiologist (physician) and Advanced Practice Providers (APPs -  Physician Assistants and Nurse Practitioners) who all work together to provide you with the care you need, when you need it.   Your next appointment:   1 year(s)  The format for your next appointment:   In Person  Provider:   You may see [name], MD or one of the following Advanced Practice Providers on your designated Care Team:   Richardson Dopp, PA-C Robbie Lis, [place]    Signed, [name], MD  12/19/2020 5:44 PM    [place]

## 2020-12-20 ENCOUNTER — Other Ambulatory Visit: Payer: Self-pay | Admitting: Cardiovascular Disease

## 2020-12-20 ENCOUNTER — Telehealth: Payer: Self-pay | Admitting: Cardiovascular Disease

## 2020-12-20 DIAGNOSIS — R072 Precordial pain: Secondary | ICD-10-CM

## 2020-12-20 LAB — BASIC METABOLIC PANEL
BUN/Creatinine Ratio: 12 (ref 10–24)
BUN: 10 mg/dL (ref 8–27)
CO2: 22 mmol/L (ref 20–29)
Calcium: 8.7 mg/dL (ref 8.6–10.2)
Chloride: 103 mmol/L (ref 96–106)
Creatinine, Ser: 0.83 mg/dL (ref 0.76–1.27)
Glucose: 102 mg/dL — ABNORMAL HIGH (ref 65–99)
Potassium: 4.6 mmol/L (ref 3.5–5.2)
Sodium: 140 mmol/L (ref 134–144)
eGFR: 94 mL/min/{1.73_m2} (ref >59)

## 2020-12-20 LAB — CBC
Hematocrit: 44.5 % (ref 37.5–51.0)
Hemoglobin: 15.1 g/dL (ref 13.0–17.7)
MCH: 29.8 pg (ref 26.6–33.0)
MCHC: 33.9 g/dL (ref 31.5–35.7)
MCV: 88 fL (ref 79–97)
Platelets: 308 10*3/uL (ref 150–450)
RBC: 5.07 x10E6/uL (ref 4.14–5.80)
RDW: 12.9 % (ref 11.6–15.4)
WBC: 7.5 10*3/uL (ref 3.4–10.8)

## 2020-12-20 LAB — HEPATIC FUNCTION PANEL
ALT: 13 IU/L (ref 0–44)
AST: 7 IU/L (ref 0–40)
Albumin: 4.5 g/dL (ref 3.8–4.8)
Alkaline Phosphatase: 92 IU/L (ref 44–121)
Bilirubin Total: 0.3 mg/dL (ref 0.0–1.2)
Bilirubin, Direct: 0.11 mg/dL (ref 0.00–0.40)
Total Protein: 7.1 g/dL (ref 6.0–8.5)

## 2020-12-20 LAB — LIPID PANEL
Chol/HDL Ratio: 4.9 ratio (ref 0.0–5.0)
Cholesterol, Total: 157 mg/dL (ref 100–199)
HDL: 32 mg/dL — ABNORMAL LOW (ref >39)
LDL Chol Calc (NIH): 59 mg/dL (ref 0–99)
Triglycerides: 431 mg/dL — ABNORMAL HIGH (ref 0–149)
VLDL Cholesterol Cal: 66 mg/dL — ABNORMAL HIGH (ref 5–40)

## 2020-12-20 NOTE — Telephone Encounter (Signed)
Pt is returning a call about results

## 2020-12-20 NOTE — Telephone Encounter (Signed)
Attempted to call the pt back and he did not answer and voicemail is not functioning now.     [name], MD  P Cv Div Ch St Triage Triglycerides higher than in past. Labs may have been non-fasting. Needs to work on diet modification (low-fat, low sugar). Other labs look good, LDL atgoal, LFT's and renal function normal. thanks

## 2020-12-21 NOTE — Telephone Encounter (Signed)
Pt aware of labs and these were non fasting the patient had labs done early afternoon and pt  had eaten breakfast that morning ./cy

## 2020-12-28 ENCOUNTER — Other Ambulatory Visit: Payer: Self-pay | Admitting: Cardiovascular Disease

## 2020-12-29 ENCOUNTER — Other Ambulatory Visit: Payer: Self-pay | Admitting: Cardiovascular Disease

## 2021-03-10 ENCOUNTER — Other Ambulatory Visit: Payer: Self-pay | Admitting: Cardiovascular Disease

## 2021-04-09 DIAGNOSIS — M9904 Segmental and somatic dysfunction of sacral region: Secondary | ICD-10-CM | POA: Diagnosis not present

## 2021-04-09 DIAGNOSIS — M5136 Other intervertebral disc degeneration, lumbar region: Secondary | ICD-10-CM | POA: Diagnosis not present

## 2021-04-09 DIAGNOSIS — M9903 Segmental and somatic dysfunction of lumbar region: Secondary | ICD-10-CM | POA: Diagnosis not present

## 2021-04-09 DIAGNOSIS — M9905 Segmental and somatic dysfunction of pelvic region: Secondary | ICD-10-CM | POA: Diagnosis not present

## 2021-04-10 DIAGNOSIS — M9904 Segmental and somatic dysfunction of sacral region: Secondary | ICD-10-CM | POA: Diagnosis not present

## 2021-04-10 DIAGNOSIS — M5136 Other intervertebral disc degeneration, lumbar region: Secondary | ICD-10-CM | POA: Diagnosis not present

## 2021-04-10 DIAGNOSIS — M9903 Segmental and somatic dysfunction of lumbar region: Secondary | ICD-10-CM | POA: Diagnosis not present

## 2021-04-10 DIAGNOSIS — M9905 Segmental and somatic dysfunction of pelvic region: Secondary | ICD-10-CM | POA: Diagnosis not present

## 2021-04-11 DIAGNOSIS — M9904 Segmental and somatic dysfunction of sacral region: Secondary | ICD-10-CM | POA: Diagnosis not present

## 2021-04-11 DIAGNOSIS — M9905 Segmental and somatic dysfunction of pelvic region: Secondary | ICD-10-CM | POA: Diagnosis not present

## 2021-04-11 DIAGNOSIS — M5136 Other intervertebral disc degeneration, lumbar region: Secondary | ICD-10-CM | POA: Diagnosis not present

## 2021-04-11 DIAGNOSIS — M9903 Segmental and somatic dysfunction of lumbar region: Secondary | ICD-10-CM | POA: Diagnosis not present

## 2021-04-16 DIAGNOSIS — M9904 Segmental and somatic dysfunction of sacral region: Secondary | ICD-10-CM | POA: Diagnosis not present

## 2021-04-16 DIAGNOSIS — M9903 Segmental and somatic dysfunction of lumbar region: Secondary | ICD-10-CM | POA: Diagnosis not present

## 2021-04-16 DIAGNOSIS — M9905 Segmental and somatic dysfunction of pelvic region: Secondary | ICD-10-CM | POA: Diagnosis not present

## 2021-04-16 DIAGNOSIS — M5136 Other intervertebral disc degeneration, lumbar region: Secondary | ICD-10-CM | POA: Diagnosis not present

## 2021-04-18 DIAGNOSIS — M9905 Segmental and somatic dysfunction of pelvic region: Secondary | ICD-10-CM | POA: Diagnosis not present

## 2021-04-18 DIAGNOSIS — M9903 Segmental and somatic dysfunction of lumbar region: Secondary | ICD-10-CM | POA: Diagnosis not present

## 2021-04-18 DIAGNOSIS — M9904 Segmental and somatic dysfunction of sacral region: Secondary | ICD-10-CM | POA: Diagnosis not present

## 2021-04-18 DIAGNOSIS — M5136 Other intervertebral disc degeneration, lumbar region: Secondary | ICD-10-CM | POA: Diagnosis not present

## 2021-07-04 ENCOUNTER — Other Ambulatory Visit: Payer: Self-pay | Admitting: Cardiovascular Disease

## 2021-12-01 ENCOUNTER — Other Ambulatory Visit: Payer: Self-pay | Admitting: Cardiovascular Disease

## 2021-12-03 NOTE — Telephone Encounter (Signed)
Pt's pharmacy is requesting a refill on pantoprazole. Would Dr. Burt Knack like to refill this medication? Please address

## 2021-12-03 NOTE — Telephone Encounter (Signed)
Pt scheduled in August for yearly appt. Will send in 60 days until pt can be seen.

## 2022-01-02 ENCOUNTER — Encounter: Payer: Self-pay | Admitting: Cardiovascular Disease

## 2022-01-02 ENCOUNTER — Ambulatory Visit (INDEPENDENT_AMBULATORY_CARE_PROVIDER_SITE_OTHER): Payer: No Typology Code available for payment source | Admitting: Cardiovascular Disease

## 2022-01-02 VITALS — BP 152/94 | HR 84 | Ht 64.5 in | Wt 166.4 lb

## 2022-01-02 DIAGNOSIS — Z720 Tobacco use: Secondary | ICD-10-CM

## 2022-01-02 DIAGNOSIS — I10 Essential (primary) hypertension: Secondary | ICD-10-CM | POA: Diagnosis not present

## 2022-01-02 DIAGNOSIS — I2510 Atherosclerotic heart disease of native coronary artery without angina pectoris: Secondary | ICD-10-CM

## 2022-01-02 DIAGNOSIS — E782 Mixed hyperlipidemia: Secondary | ICD-10-CM

## 2022-01-02 NOTE — Patient Instructions (Signed)
Medication Instructions:  Your physician recommends that you continue on your current medications as directed. Please refer to the Current Medication list given to you today.  *If you need a refill on your cardiac medications before your next appointment, please call your pharmacy*   Lab Work: CMET, CBC, Lipids today If you have labs (blood work) drawn today and your tests are completely normal, you will receive your results only by: [place] (if you have MyChart) OR A paper copy in the mail If you have any lab test that is abnormal or we need to change your treatment, we will call you to review the results.   Testing/Procedures: NONE   Follow-Up: At [facility], you and your health needs are our priority.  As part of our continuing mission to provide you with exceptional heart care, we have created designated Provider Care Teams.  These Care Teams include your primary Cardiologist (physician) and Advanced Practice Providers (APPs -  Physician Assistants and Nurse Practitioners) who all work together to provide you with the care you need, when you need it.  We recommend signing up for the patient portal called "MyChart".  Sign up information is provided on this After Visit Summary.  MyChart is used to connect with patients for Virtual Visits (Telemedicine).  Patients are able to view lab/test results, encounter notes, upcoming appointments, etc.  Non-urgent messages can be sent to your provider as well.   To learn more about what you can do with MyChart, go to [email or web address].    Your next appointment:   1 year(s)  The format for your next appointment:   In Person  Provider:   [name], MD       Important Information About Sugar

## 2022-01-02 NOTE — Progress Notes (Signed)
Cardiology Office Note:    Date:  01/09/2022   ID:  Alexander [name], DOB November 21, 1950, MRN [id number]  PCP:  Patient, No Pcp Per   [facility] Cardiologist:  [name], MD     Referring MD: No ref. provider found   Chief Complaint  Patient presents with   Coronary Artery Disease    History of Present Illness:    Alexander SEEHAFER is a 71 y.o. male with a hx of CAD, presenting for follow-up evaluation. Patient initially presented in 2008 with an acute inferior wall MI, treated with primary PCI using overlapping Taxus drug-eluting stents the right coronary artery. He has been maintained on long-term dual antiplatelet therapy with aspirin and Plavix. Patient has long-standing whitecoat hypertension.   The patient is here alone today. He is doing well and denies symptoms of chest pain, dyspnea, edema, orthopnea, PND, or palpitations. He is active with no exertional symptoms.   Past Medical History:  Diagnosis Date   Anxiety    CAD (coronary artery disease)    GERD (gastroesophageal reflux disease)    HTN (hypertension)    Hyperlipidemia    Myocardial infarct (HCC)     MYOCARDIAL INFARCTION, INFERIOR WALL, INITIAL EPISODE (ICD-410.41)RX OVERLAPPING  DRUG-ELUDTING STENTS RCA WITH RESIDUAL DISEASE LAD & CFX. EF INITIALLY 40% NOW 60-65%    Past Surgical History:  Procedure Laterality Date   CARDIAC CATHETERIZATION     CORONARY ANGIOPLASTY      Current Medications: Current Meds  Medication Sig   amLODipine (NORVASC) 5 MG tablet TAKE 1 TABLET BY MOUTH DAILY. PT NEEDS TO MAKE APPT WITH PROVIDER FOR FURTHER REFILLS - 1ST ATTEMPT   aspirin EC 81 MG tablet Take 81 mg by mouth daily.   atorvastatin (LIPITOR) 20 MG tablet TAKE 1 TABLET BY MOUTH EVERY DAY   carvedilol (COREG) 12.5 MG tablet Take 1 tablet (12.5 mg total) by mouth 2 (two) times daily with a meal.   clopidogrel (PLAVIX) 75 MG tablet Take 1 tablet (75 mg total) by mouth daily.   lisinopril (ZESTRIL) 40 MG  tablet TAKE 1 TABLET BY MOUTH EVERY DAY   nitroGLYCERIN (NITROSTAT) 0.4 MG SL tablet Place 1 tablet (0.4 mg total) under the tongue every 5 (five) minutes as needed for chest pain.   pantoprazole (PROTONIX) 40 MG tablet TAKE 1 TABLET BY MOUTH EVERY DAY     Allergies:   Carvedilol and Hydrochlorothiazide   Social History   Socioeconomic History   Marital status: Married    Spouse name: Not on file   Number of children: Not on file   Years of education: Not on file   Highest education level: Not on file  Occupational History   Not on file  Tobacco Use   Smoking status: Every Day   Smokeless tobacco: Never  Substance and Sexual Activity   Alcohol use: No   Drug use: No   Sexual activity: Not on file  Other Topics Concern   Not on file  Social History Narrative   Not on file   Social Determinants of Health   Financial Resource Strain: Not on file  Food Insecurity: Not on file  Transportation Needs: Not on file  Physical Activity: Not on file  Stress: Not on file  Social Connections: Not on file     Family History: The patient's family history includes Diabetes in his father; Heart disease in his mother; Other in his unknown relative.  ROS:   Please see the history of  present illness.    All other systems reviewed and are negative.  EKGs/Labs/Other Studies Reviewed:    EKG:  EKG is ordered today.  The ekg ordered today demonstrates NSR 84 bpm, occasional PVC  Recent Labs: 01/07/2022: ALT 9; BUN 9; Creatinine, Ser 0.90; Hemoglobin 15.8; Platelets 315; Potassium 4.6; Sodium 135  Recent Lipid Panel    Component Value Date/Time   CHOL 144 01/07/2022 0850   TRIG 306 (H) 01/07/2022 0850   HDL 33 (L) 01/07/2022 0850   CHOLHDL 4.4 01/07/2022 0850   CHOLHDL 5 06/07/2014 0741   VLDL 55.2 (H) 06/07/2014 0741   LDLCALC 63 01/07/2022 0850   LDLDIRECT 64.7 06/07/2014 0741     Risk Assessment/Calculations:           Physical Exam:    VS:  BP (!) 152/94   Pulse 84    Ht 5' 4.5" (1.638 m)   Wt 166 lb 6.4 oz (75.5 kg)   SpO2 94%   BMI 28.12 kg/m     Wt Readings from Last 3 Encounters:  01/02/22 166 lb 6.4 oz (75.5 kg)  12/19/20 171 lb (77.6 kg)  10/29/19 160 lb (72.6 kg)     GEN:  Well nourished, well developed in no acute distress HEENT: Normal NECK: No JVD; No carotid bruits LYMPHATICS: No lymphadenopathy CARDIAC: RRR, no murmurs, rubs, gallops RESPIRATORY:  Clear to auscultation without rales, wheezing or rhonchi  ABDOMEN: Soft, non-tender, non-distended MUSCULOSKELETAL:  No edema; No deformity  SKIN: Warm and dry NEUROLOGIC:  Alert and oriented x 3 PSYCHIATRIC:  Normal affect   ASSESSMENT:    1. Coronary artery disease involving native coronary artery of native heart without angina pectoris   2. Essential hypertension   3. Mixed hyperlipidemia   4. Tobacco abuse    PLAN:    In order of problems listed above:  Stable without symptoms of angina many years out from PCI. He continues on ASA and clopidogrel after being treated with first generation DES. He has tolerated DAPT well over the years with no bleeding problems. Secondary risk reduction measures as outlined below.  BP remains elevated on my recheck, but long hx of white coat HTN. Reports better readings at home and does not wish to increase his medications.  Treated with a statin drug. Will update labs. Diet/lifestyle modification discussed.  Continues to smoke. No plans to quit.            Medication Adjustments/Labs and Tests Ordered: Current medicines are reviewed at length with the patient today.  Concerns regarding medicines are outlined above.  Orders Placed This Encounter  Procedures   CBC   Comprehensive metabolic panel   Lipid panel   EKG 12-Lead   No orders of the defined types were placed in this encounter.   Patient Instructions  Medication Instructions:  Your physician recommends that you continue on your current medications as directed. Please refer  to the Current Medication list given to you today.  *If you need a refill on your cardiac medications before your next appointment, please call your pharmacy*   Lab Work: CMET, CBC, Lipids today If you have labs (blood work) drawn today and your tests are completely normal, you will receive your results only by: [place] (if you have MyChart) OR A paper copy in the mail If you have any lab test that is abnormal or we need to change your treatment, we will call you to review the results.   Testing/Procedures: NONE   Follow-Up:  At [facility], you and your health needs are our priority.  As part of our continuing mission to provide you with exceptional heart care, we have created designated Provider Care Teams.  These Care Teams include your primary Cardiologist (physician) and Advanced Practice Providers (APPs -  Physician Assistants and Nurse Practitioners) who all work together to provide you with the care you need, when you need it.  We recommend signing up for the patient portal called "MyChart".  Sign up information is provided on this After Visit Summary.  MyChart is used to connect with patients for Virtual Visits (Telemedicine).  Patients are able to view lab/test results, encounter notes, upcoming appointments, etc.  Non-urgent messages can be sent to your provider as well.   To learn more about what you can do with MyChart, go to [email or web address].    Your next appointment:   1 year(s)  The format for your next appointment:   In Person  Provider:   [name], MD       Important Information About Sugar         Signed, [name], MD  01/09/2022 10:38 PM    [place]

## 2022-01-07 ENCOUNTER — Other Ambulatory Visit: Payer: No Typology Code available for payment source

## 2022-01-07 DIAGNOSIS — I2510 Atherosclerotic heart disease of native coronary artery without angina pectoris: Secondary | ICD-10-CM

## 2022-01-07 DIAGNOSIS — Z720 Tobacco use: Secondary | ICD-10-CM | POA: Diagnosis not present

## 2022-01-07 DIAGNOSIS — I10 Essential (primary) hypertension: Secondary | ICD-10-CM | POA: Diagnosis not present

## 2022-01-07 DIAGNOSIS — E782 Mixed hyperlipidemia: Secondary | ICD-10-CM

## 2022-01-08 LAB — COMPREHENSIVE METABOLIC PANEL
ALT: 9 IU/L (ref 0–44)
AST: 12 IU/L (ref 0–40)
Albumin/Globulin Ratio: 1.9 (ref 1.2–2.2)
Albumin: 4.6 g/dL (ref 3.8–4.8)
Alkaline Phosphatase: 109 IU/L (ref 44–121)
BUN/Creatinine Ratio: 10 (ref 10–24)
BUN: 9 mg/dL (ref 8–27)
Bilirubin Total: 0.4 mg/dL (ref 0.0–1.2)
CO2: 21 mmol/L (ref 20–29)
Calcium: 9 mg/dL (ref 8.6–10.2)
Chloride: 99 mmol/L (ref 96–106)
Creatinine, Ser: 0.9 mg/dL (ref 0.76–1.27)
Globulin, Total: 2.4 g/dL (ref 1.5–4.5)
Glucose: 124 mg/dL — ABNORMAL HIGH (ref 70–99)
Potassium: 4.6 mmol/L (ref 3.5–5.2)
Sodium: 135 mmol/L (ref 134–144)
Total Protein: 7 g/dL (ref 6.0–8.5)
eGFR: 91 mL/min/{1.73_m2} (ref >59)

## 2022-01-08 LAB — CBC
Hematocrit: 47.9 % (ref 37.5–51.0)
Hemoglobin: 15.8 g/dL (ref 13.0–17.7)
MCH: 29.5 pg (ref 26.6–33.0)
MCHC: 33 g/dL (ref 31.5–35.7)
MCV: 89 fL (ref 79–97)
Platelets: 315 10*3/uL (ref 150–450)
RBC: 5.36 x10E6/uL (ref 4.14–5.80)
RDW: 12.7 % (ref 11.6–15.4)
WBC: 7.9 10*3/uL (ref 3.4–10.8)

## 2022-01-08 LAB — LIPID PANEL
Chol/HDL Ratio: 4.4 ratio (ref 0.0–5.0)
Cholesterol, Total: 144 mg/dL (ref 100–199)
HDL: 33 mg/dL — ABNORMAL LOW (ref >39)
LDL Chol Calc (NIH): 63 mg/dL (ref 0–99)
Triglycerides: 306 mg/dL — ABNORMAL HIGH (ref 0–149)
VLDL Cholesterol Cal: 48 mg/dL — ABNORMAL HIGH (ref 5–40)

## 2022-01-10 ENCOUNTER — Other Ambulatory Visit: Payer: Self-pay | Admitting: Cardiovascular Disease

## 2022-02-02 ENCOUNTER — Other Ambulatory Visit: Payer: Self-pay | Admitting: Cardiovascular Disease

## 2022-02-03 ENCOUNTER — Other Ambulatory Visit: Payer: Self-pay | Admitting: Cardiovascular Disease

## 2023-03-17 ENCOUNTER — Ambulatory Visit: Payer: No Typology Code available for payment source | Admitting: Cardiovascular Disease
